# Patient Record
Sex: Female | Born: 1980 | Race: White | Hispanic: No | Marital: Single | Smoking: Former smoker
Health system: Western US, Community
[De-identification: ages and names within clinical notes are randomized; demographics above are authoritative.]

---

## 2019-10-11 NOTE — Telephone Encounter (Signed)
LVM to schedule w/ GO from referral

## 2019-11-15 NOTE — Telephone Encounter (Signed)
LVM to r/s 4/14 appointment to the [facility] clinic

## 2019-11-17 ENCOUNTER — Encounter: Attending: MD

## 2019-11-17 ENCOUNTER — Ambulatory Visit: Admit: 2019-11-17 | Discharge: 2019-11-30 | Payer: TRICARE (CHAMPUS) | Attending: MD

## 2019-11-17 DIAGNOSIS — M5126 Other intervertebral disc displacement, lumbar region: Secondary | ICD-10-CM

## 2019-11-17 NOTE — Progress Notes (Signed)
Melanie Miller is a 39 y.o. female.   Chief Complaint   Patient presents with   . Back Pain     L-spine         HISTORY OF PRESENT ILLNESS     Back Pain  This is a chronic problem. The pain is present in the lumbar spine. The quality of the pain is described as aching, burning and shooting. Radiates to: bilateral legs. The symptoms are aggravated by bending and standing (kneeling, lifting, sitting on the toilet too long). Associated symptoms include bladder incontinence, headaches, leg pain, numbness and tingling. Pertinent negatives include no bowel incontinence or weakness. Risk factors include obesity and menopause. She has tried NSAIDs, analgesics, chiropractic manipulation, heat and bed rest (Icy Hot patches, Bio Freeze Patches, massage) for the symptoms.   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 presents with low back pain for years but worse with acute flareup of the last 2 years low back worse and radicular leg pain with numbness into the legs worse in the feet with weakness and pain  Evaluated at [facility] with injections with some success  Diagnosed with idiopathic intracranial hypertension with spinal taps complicated by spinal headaches and pain  Pain with sitting better with squatting laying down okay walking  Chronic neck and shoulder pain  Pain varies from 3-8 out of 10 worse with bending lifting standing better laying down giving way and numbness in both legs and knees  X-rays lumbar spine 09/13/2019 AP lateral view confirms degenerative changes facet joints particularly L4-5 alignment otherwise acceptable      No past medical history on file.  No past surgical history on file.  Allergies   Allergen Reactions   . Amoxicillin Nausea And Vomiting   . Doxycycline Other (See Comments)     Idiopathic intercranial hypetertension   . Oxycodone Hives     No family history on file.   Social History     Tobacco Use    Smoking Status Never Smoker   Smokeless Tobacco Never Used   Tobacco Comment    medical cannabus     Body mass index is 40.77 kg/m?Marland Kitchen       Last A1C: No results found for: HGBA1C, HGBA1CPOCT, GLU    Outpatient Medications Marked as Taking for the 11/17/19 encounter (Office Visit) with Lonia Mad, MD   Medication Sig Dispense Refill   . cyclobenzaprine (FLEXERIL) 10 mg tablet Take 10 mg by mouth 3 times daily as needed for Muscle spasms.     Marland Kitchen estradioL (CLIMARA) 0.1 mg/24 hr Place 1 patch onto the skin once a week.     Marland Kitchen ibuprofen (ADVIL) 800 MG tablet Take 800 mg by mouth every 6 hours as needed for Pain.          REVIEW OF SYSTEMS   Review of Systems   Gastrointestinal: Negative for bowel incontinence.   Genitourinary: Positive for bladder incontinence.   Musculoskeletal: Positive for back pain.   Neurological: Positive for tingling, numbness and headaches. Negative for weakness.       Complete review of systems performed in office via patient questionnaire.     PHYSICAL EXAM   There were no vitals filed for this visit.  Height: 5' 5 (165.1 cm)  Wt Readings from Last 3 Encounters:   11/17/19 245 lb (111.1 kg)       Ortho Exam  Physical Exam  39 year old white female antalgic gait slow  to walk on toes and heels with back and radicular pain  Lumbar spine loss with loss of lordosis muscle spasm tenderness crepitus swelling no instability  Cervical spine pain on end range motion crepitus swelling no instability  Upper extremities restricted motion both shoulders positive impingement sign mild sensory changes minimal weakness upper extremities reflexes diminished  Lower extremities restricted motion hips knees ankles with patchy sensory changes diminished reflexes  Patient is awake alert and fully oriented, no acute distress well-nourished,   Head and neck within normal limits  Neck examination,negative Spurling sign, negative Lhermittes sign  Thoracic examination, normal alignment full range of motion no local  tenderness  Upper extremities, full range of motion shoulder elbow and wrists without pain tenderness or limitation no instability no swelling. Normal motor function without atrophy  vascular examination normal, no evidence of lymphadenopathy  Lower extremities, full range of motion hips knees and ankles without pain tenderness or limitation no local swelling or instability. Motor examination within normal limits without atrophy. . Vascular examination normal        *All or part of this note was transcribed using speech recognition software and may have been transferred into this note. Please contact us for clarification if any questions arise relating to the wording of this document.      IMAGING STUDY     No results found.       Additional Imaging  X-rays lumbar spine 09/13/2019 AP lateral view confirms degenerative changes facet joints particularly L4-5 alignment otherwise acceptable    ASSESSMENT        SNOMED CT(R)    1. Lumbar herniated disc  PROLAPSED LUMBAR INTERVERTEBRAL DISC    2. Lumbar radiculopathy  LUMBAR RADICULOPATHY    3. Herniated cervical intervertebral disc  PROLAPSED CERVICAL INTERVERTEBRAL DISC        #1 chronic mechanical back radicular leg pain numbness and weakness worse in the last 2 years prior work-up at [facility] with injections degenerative changes with facet arthropathy L4-5 potential stenosis lumbar spine versus herniated disc  #2 chronic neck and radicular shoulder pain  #3 shoulder impingement  #4 idiopathic intracranial hypertension serial spinal taps  #5 [employer] service-connected posttraumatic stress disorder  #6 allergic to amoxicillin oxycodone doxycycline       PLAN        Today we discussed proceeding with MRI imaging lumbar spine to assess the canal and discs in more detail  Further work-up could include conservative measures therapy and spinal injections which are diagnostic and therapeutic including facet blocks versus selective nerve root blocks  Potential work-up and treatment  with management with pain management           No follow-ups on file.        *This note was dictated using voice recognition software. If you have any questions concerning the content, please call our office at [phone]  *All or part of this note was transcribed using speech recognition software and may have been transferred into this note. Please contact us for clarification if any questions arise relating to the wording of this document.

## 2019-11-26 ENCOUNTER — Ambulatory Visit: Admit: 2019-11-26 | Discharge: 2019-11-29 | Attending: Hematology & Oncology

## 2019-11-26 DIAGNOSIS — D72829 Elevated white blood cell count, unspecified: Secondary | ICD-10-CM

## 2019-11-26 NOTE — Consults (Signed)
Pt has no symptoms to report.

## 2019-11-26 NOTE — Consults (Signed)
[facility]  [facility], [place]  Medical Oncology Consultation    PATIENT: Melanie Miller, Melanie Miller  CHART#: [id number]  ACCT#: [id number]  PATIENT DOB: 1981/02/01  DATE OF SERVICE:  11/26/2019.    REFERRING PHYSICIAN:  Charisse N. Siapno, MD.    CHIEF COMPLAINT:  Leukocytosis.    HISTORY OF PRESENT ILLNESS:   Recently, on 09/16/2019, Melanie Miller had a  repeat CBC and that showed that she had a persistent elevated white count   at 13,600.  Her hemoglobin was normal at 14.4, her platelet count was   normal at 253.  The patient had 63% granulocytes, 26 lymphs and 8   monocytes.  Patient is followed by [name], MD.  Melanie Miller  other CBC, done several months before, likewise showed an elevated white   count at 12,000 (above the normal range of 6000 to 10,000) and because of   this a consultation was obtained with hematology.    PAST MEDICAL HISTORY:  The patient has had a longstanding problem with low  back pain.  Most recently, she was seen by  [name]. [name], MD, and orthopedist and he has ordered an MRI of the   lumbar spine.  Melanie Miller has had pain in the back for years.  When I   say for years, at least 6 or 7 years, and she was treated at the [facility]   several years ago with steroid injections that seemed to improve her back   discomfort.  Over the last 6 months, the pain has gotten more severe and   because of this, she got an appointment to see [name]. [name], MD.    The patient has pain over her L-spine and it seems to be aggravated by   periods of standing, bending, lifting, or kneeling.  It is an aching   discomfort and it radiates down into the legs.  The patient, at times, has  some bladder incontinence and she feels some numbness and tingling down   both legs.  The patient also has problems with pain in her neck with   radiation into her right shoulder.  This has been stable for several   years.  It is the pain in the back that is increased and because of this   patient is  going to get an MRI when the [place] approves of the procedure.  The  patient has idiopathic intracranial hypertension and this dates back 5 or   6 years.  It is felt, or was felt, that perhaps this was brought about by   doxycycline.  The patient is followed by a neuroophthalmologist who looks   for papilledema and when this occurred they would do lumbar punctures to   tap off the fluid.  The etiology of idiopathic intracranial hypertension   is not known.  It usually occurs in females and they are usually fairly   overweight.    Patient is a never smoker.  She does smoke marijuana cigarettes and this   is intermittently.  The patient stopped smoking 13 years ago, but she   takes cannabis medically.  She has aching in her shoulder and neck with   some neuropathy in her hands.  The patient had a hysterectomy and   oophorectomy many years ago, in 2006, because of endometriosis.  The   patient did not get on hormonal replacement therapy until about 3 or 4   years ago.  The patient was in the [place] and she  has 100% disability from   the [facility] for posttraumatic stress disorder and that was not inquired into.    FAMILY HISTORY:  The patient is adopted and knows of no diseases that run   in her family.  She has traced down her biological father and he lives in   [place] and is married and has several children, but he does not   wish to see her, nor has he ever contacted her.  Her adoptive mother died   of multiple myeloma when she was 39 years of age and she went off from   [place] to live with her father who lived in [place], [place].  Her   adoptive father subsequently developed a high-grade large B cell lymphoma   and was cured with chemotherapy.  The patient lived in [place] from 39 years  of age until she went into the [place] at 45.    SOCIAL HISTORY:  Again, the patient was adopted in [place].  Her parents   broke up I guess when she was 49 years of age and she went to live with her  mother in [place] and lived with her  mother for 4 years and then her   mother passed away of multiple myeloma.  Then she moved back to [place],   [place] where her father lived and she was raised by her father and his   family until she was 34 (for 10 years).  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  The patient went into the [employer] and was in the [employer] for 4 or   5 years.  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  The patient has been   divorced now for 18 years.  She raised her children in [place], [place].    She moved up here to [place] [facility] [facility] 2 or 3 years ago to get out of the [place]  and [place].  She lives in [place] by herself.  She has dogs.  She   has no unusual hobbies.  She does not work with sprays, acrylics, paints,   pesticides, etc.  She works at [employer].  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    REVIEW OF SYSTEMS:  The patient does not have any headaches.  She has not   had a spinal tap in more than 2-1/2 years to offload the pressure in this   normal pressure hydrocephalus.  Her appetite is the same.  She relates   that all the doctors tell her that her problems are because she is   overweight.  The patient has no problems swallowing, no cough, no chest   pain, nothing that sounds like angina.  No real arthritis, except for in   the low back and in the right shoulder.  She does have numbness and   tingling in her hands and feet and the feet is due to radiation of pain   from her low back.  The patient has not had a change in bowel habits.  She  states when she lies down horizontally she will fall asleep.  In general,   she has been doing well and feeling well.  She has had problems with   depression on  and off all of her life and she is on no medications for   this right now.  She takes Flexeril t.i.d. as needed for back pain, but   there is really nothing for her depression.  She is on estradiol 0.1 mg 2   patches once a week.  She occasionally takes ibuprofen for her headaches.   Again, the patient has no new joint or skin problems.    PHYSICAL EXAMINATION:  VITAL SIGNS:  Her blood pressure is 125/60.  Her temperature is 98.  Her   pulse is 80.  Her O2 sats are 95%.  The patient is 5 feet 5 inches.  She   is 242 pounds.  GENERAL:  Reveals a 39 year old lady who appears her stated age.  She has   brown hair, brown eyes. She is a big lady.  She is somewhat overweight,   but she has fairly large hands.  She has broad shoulders, she has a square  jaw and a large head.  She has very hypertrophied muscles in her calves   and thighs.  She is overweight, but she is also quite muscular.  She is   alert and conversive.  HEENT:  Mouth is without lesions.  NECK:  Without adenopathy.  CHEST:  Clear.  HEART:  She has a regular rate and rhythm without murmurs.  UPPER EXTREMITIES:  Fairly large and muscular.  There is excess adiposity   in the upper arms bilaterally.  She has large tattoos on both of her   forearms and her upper extremities as well.  AXILLAE:  Without adenopathy.  BREASTS:  Symmetrical, without masses.  ABDOMEN:  Soft, obese, without organomegaly.  EXTREMITIES:  Quite well developed - her forearms, her hands, her calf   muscles and her thighs.    LABORATORIES, IMAGING AND ELECTRONIC RECORD:  White count done back on   10/27/2019 (a month ago) shows an elevated white count is 11.8, red blood   cells are 5.26 - elevated.  Hemoglobin is 15.3, hematocrit is 45.7, MCV is  86.9, MCH is 29.1, MCHC is 33.5, all within normal limits.  The patient's   differential shows 58% neutrophils, 32% lymphs, 7 monocytes, 1 eosinophil   and 0.6% basophils.  Differential on 09/16/2019 was normal.  Lipid panel   shows a cholesterol  of 216, HDL is depressed at 34.8.  Triglycerides are   elevated at 229 and LDL cholesterol of 136.  CBC 6 months ago (in 04/2019)  shows that her white count at that point in time was likewise elevated at   11.8, hemoglobin was 14.6, hematocrit was 43.7.  Metabolic profile done 10  months ago revealed no abnormalities.  Hepatitis C antibody screen is   negative.    IMPRESSION:  1.  Leukocytosis, mild, with normal hemoglobin and platelet count as well   as a normal differential.  So just pure leukocytosis with no abnormal   cells.  2.  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  3.  Endometriosis post total abdominal hysterectomy done back in 2006   without any hormonal replacement therapy until 2 or 3 years ago.  4.  Posttraumatic stress disorder by history -- on disability for same.    PLAN:  On physical examination  and by review of systems, except for the   chronic back pain, the patient is stable and doing quite well.  The   patient is adopted and we do not know her family history, but both her   adoptive parents have suffered problems due to hematological problems -   plasma cell myeloma and large B cell lymphoma.  The patient has grown   children, but they live far away out of state.  Patient is divorced and   has been divorced now for probably 20 years.  The patient has no unusual   hobbies, does not work with any sprays or acrylics.  I do not think this   is related to any type of exposure.  At the present time, would just   recommend following the patient with CBCs every 6 months.  Dr. Ivan Anchors, who is a famous hematologist at the [facility] followed   more than 100 patients with leukocytosis with a normal differential for   20-25 years.  None of these patients ever evolved to have any significant   problems.  There was an increased incidence of significant COPD in the   smokers and his postulant was that this may be due to mild  subclinical   chronic bronchitis or chronic inflammation of the airways due to inhaled   particulate matter.  The patient smoked regularly until 2008 and now she   is using marijuana on a regular basis, which is probably equivalently   toxic to tobacco.  I would just recommend at this point in time for the   patient to have Charisse N. Siapno, MD, check her CBC every 6 months.  I   do not think anything bad is going on with the patient as far as a   hematological problem.    Rhona Raider Tyra Michelle, MD  D: 11/26/2019 17:41  T: 11/27/2019 10:18  J/R: [id number]  SWEMA / LESLD  KG:[id number] N. Megan Salon, MD

## 2019-12-06 ENCOUNTER — Ambulatory Visit: Admit: 2019-12-06 | Payer: TRICARE (CHAMPUS) | Attending: DO

## 2019-12-06 DIAGNOSIS — R198 Other specified symptoms and signs involving the digestive system and abdomen: Secondary | ICD-10-CM

## 2019-12-06 NOTE — H&P (Signed)
CC: Have leakage from the anus    HPI:    This is a pleasant 38 y.o. female who was referred by Davis, Denise A, FNP-C for Anal discharge.  She has continuous clear mucus/fluid discharges from the anal opening.  It occurs on a daily basis.  She has to placing gauze up in the area and replace it multiple times during the day.  She denies any blood or mucus in her stools.  There is been no changes in her bowel habits.  She typically has 3-4 bowel movements on a daily basis.  There is no family history of colon or rectal cancer.    PMH:    Past Medical History:   Diagnosis Date   . Anemia    . Anxiety    . Asthma      Family History   Adopted: Yes     Past Surgical History:   Procedure Laterality Date   . HYSTERECTOMY     . OOPHORECTOMY       Social History     Socioeconomic History   . Marital status: Single     Spouse name: Not on file   . Number of children: Not on file   . Years of education: Not on file   . Highest education level: Not on file   Occupational History   . Not on file   Social Needs   . Financial resource strain: Not on file   . Food insecurity     Worry: Not on file     Inability: Not on file   . Transportation needs     Medical: Not on file     Non-medical: Not on file   Tobacco Use   . Smoking status: Former Smoker     Packs/day: 1.00     Types: Cigarettes     Quit date: 2008     Years since quitting: 13.3   . Smokeless tobacco: Never Used   . Tobacco comment: medical cannabus   Substance and Sexual Activity   . Alcohol use: Not Currently   . Drug use: Not Currently   . Sexual activity: Not Currently   Lifestyle   . Physical activity     Days per week: Not on file     Minutes per session: Not on file   . Stress: Not on file   Relationships   . Social connections     Talks on phone: Not on file     Gets together: Not on file     Attends religious service: Not on file     Active member of club or organization: Not on file     Attends meetings of clubs or organizations: Not on file     Relationship  status: Not on file   . Intimate partner violence     Fear of current or ex partner: Not on file     Emotionally abused: Not on file     Physically abused: Not on file     Forced sexual activity: Not on file   Other Topics Concern   . Caffeine Concern Not Asked   . Exercise Not Asked   Social History Narrative   . Not on file         Outpatient Encounter Medications as of 12/06/2019   Medication Sig Dispense Refill   . estradioL (CLIMARA) 0.1 mg/24 hr Place 2 patches onto the skin once a week.      . ibuprofen (ADVIL) 800   MG tablet Take 800 mg by mouth every 6 hours as needed for Pain.       No facility-administered encounter medications on file as of 12/06/2019.      Allergies   Allergen Reactions   . Amoxicillin Nausea And Vomiting   . Doxycycline Other (See Comments)     Idiopathic intercranial hypetertension   . Oxycodone Hives         Review of Systems   Constitutional: Positive for fatigue.   Eyes: Positive for photophobia.   Musculoskeletal: Positive for back pain and neck pain.   Neurological: Positive for headaches.   All other systems reviewed and are negative.        Physical Exam  Constitutional:       Appearance: She is well-developed.   HENT:      Head: Normocephalic and atraumatic.   Eyes:      Pupils: Pupils are equal, round, and reactive to light.   Neck:      Musculoskeletal: Normal range of motion and neck supple.   Cardiovascular:      Rate and Rhythm: Normal rate and regular rhythm.   Pulmonary:      Effort: Pulmonary effort is normal.      Breath sounds: Normal breath sounds.   Abdominal:      General: Bowel sounds are normal.      Palpations: Abdomen is soft.   Genitourinary:     Comments: There are some anal tags around the anal opening.  No fistula fissure or thrombosis is noted.  Digital rectal exam is normal.  Anoscopy reveals grade 2 internal hemorrhoids in all 3 quadrants without inflammation or irritation.  There is normal-appearing rectal mucosa above the dentate line.  Musculoskeletal:  Normal range of motion.   Skin:     General: Skin is warm and dry.   Neurological:      Mental Status: She is alert and oriented to person, place, and time.         Vitals:    12/06/19 1410   Pulse: 93   SpO2: 97%       Assessment:  Problem List Items Addressed This Visit     Anal discharge - Primary          No follow-ups on file.    Plan:  ---The etiology of the problem has been discussed and explained.  I have described the various treatment options with my recommendations and a handout has been provided.  All of their questions have been answered and discussed.  ---I have recommended that the patient undergo the discussed surgical procedure.  ---A PARQ discussion detailing the risks, benefits, and alternative options for the procedure have been discussed in detail.  We discussed the risks of bleeding, infection, perforation, missed lesions, and failure to resolve the issue.  All of the questions have been answered and discussed.  ASA Class I  ---This note was, in part, created with the aid of voice recognition software.  ---On examination today I do not see any of the usual problems that would precipitate anal discharge.  She has minimal internal hemorrhoids.  There is no inflammation of the lower rectum.  There is no anal fistula and I see no evidence of ulcerative colitis or Crohn's disease around the lower rectum.  I have recommended a colonoscopy to evaluate for possible solitary rectal ulcer or collagenous colitis.  I have reviewed with her the risk, benefits and possible complications of the procedure and all of her   questions have been answered today.    ---She will be set up for a procedure:  ---Colonoscopy--[id number] with moderate sedation under propofol

## 2019-12-06 NOTE — H&P (View-Only) (Signed)
CC: Have leakage from the anus    HPI:    This is a pleasant 39 y.o. female who was referred by Willy Eddy, FNP-C for Anal discharge.  She has continuous clear mucus/fluid discharges from the anal opening.  It occurs on a daily basis.  She has to placing gauze up in the area and replace it multiple times during the day.  She denies any blood or mucus in her stools.  There is been no changes in her bowel habits.  She typically has 3-4 bowel movements on a daily basis.  There is no family history of colon or rectal cancer.    PMH:    Past Medical History:   Diagnosis Date   . Anemia    . Anxiety    . Asthma      Family History   Adopted: Yes     Past Surgical History:   Procedure Laterality Date   . HYSTERECTOMY     . OOPHORECTOMY       Social History     Socioeconomic History   . Marital status: Single     Spouse name: Not on file   . Number of children: Not on file   . Years of education: Not on file   . Highest education level: Not on file   Occupational History   . Not on file   Social Needs   . Financial resource strain: Not on file   . Food insecurity     Worry: Not on file     Inability: Not on file   . Transportation needs     Medical: Not on file     Non-medical: Not on file   Tobacco Use   . Smoking status: Former Smoker     Packs/day: 1.00     Types: Cigarettes     Quit date: 2008     Years since quitting: 13.3   . Smokeless tobacco: Never Used   . Tobacco comment: medical cannabus   Substance and Sexual Activity   . Alcohol use: Not Currently   . Drug use: Not Currently   . Sexual activity: Not Currently   Lifestyle   . Physical activity     Days per week: Not on file     Minutes per session: Not on file   . Stress: Not on file   Relationships   . Social Wellsite geologist on phone: Not on file     Gets together: Not on file     Attends religious service: Not on file     Active member of club or organization: Not on file     Attends meetings of clubs or organizations: Not on file     Relationship  status: Not on file   . Intimate partner violence     Fear of current or ex partner: Not on file     Emotionally abused: Not on file     Physically abused: Not on file     Forced sexual activity: Not on file   Other Topics Concern   . Caffeine Concern Not Asked   . Exercise Not Asked   Social History Narrative   . Not on file         Outpatient Encounter Medications as of 12/06/2019   Medication Sig Dispense Refill   . estradioL (CLIMARA) 0.1 mg/24 hr Place 2 patches onto the skin once a week.      Marland Kitchen ibuprofen (ADVIL) 800  MG tablet Take 800 mg by mouth every 6 hours as needed for Pain.       No facility-administered encounter medications on file as of 12/06/2019.      Allergies   Allergen Reactions   . Amoxicillin Nausea And Vomiting   . Doxycycline Other (See Comments)     Idiopathic intercranial hypetertension   . Oxycodone Hives         Review of Systems   Constitutional: Positive for fatigue.   Eyes: Positive for photophobia.   Musculoskeletal: Positive for back pain and neck pain.   Neurological: Positive for headaches.   All other systems reviewed and are negative.        Physical Exam  Constitutional:       Appearance: She is well-developed.   HENT:      Head: Normocephalic and atraumatic.   Eyes:      Pupils: Pupils are equal, round, and reactive to light.   Neck:      Musculoskeletal: Normal range of motion and neck supple.   Cardiovascular:      Rate and Rhythm: Normal rate and regular rhythm.   Pulmonary:      Effort: Pulmonary effort is normal.      Breath sounds: Normal breath sounds.   Abdominal:      General: Bowel sounds are normal.      Palpations: Abdomen is soft.   Genitourinary:     Comments: There are some anal tags around the anal opening.  No fistula fissure or thrombosis is noted.  Digital rectal exam is normal.  Anoscopy reveals grade 2 internal hemorrhoids in all 3 quadrants without inflammation or irritation.  There is normal-appearing rectal mucosa above the dentate line.  Musculoskeletal:  Normal range of motion.   Skin:     General: Skin is warm and dry.   Neurological:      Mental Status: She is alert and oriented to person, place, and time.         Vitals:    12/06/19 1410   Pulse: 93   SpO2: 97%       Assessment:  Problem List Items Addressed This Visit     Anal discharge - Primary          No follow-ups on file.    Plan:  ---The etiology of the problem has been discussed and explained.  I have described the various treatment options with my recommendations and a handout has been provided.  All of their questions have been answered and discussed.  ---I have recommended that the patient undergo the discussed surgical procedure.  ---A PARQ discussion detailing the risks, benefits, and alternative options for the procedure have been discussed in detail.  We discussed the risks of bleeding, infection, perforation, missed lesions, and failure to resolve the issue.  All of the questions have been answered and discussed.  ASA Class I  ---This note was, in part, created with the aid of voice recognition software.  ---On examination today I do not see any of the usual problems that would precipitate anal discharge.  She has minimal internal hemorrhoids.  There is no inflammation of the lower rectum.  There is no anal fistula and I see no evidence of ulcerative colitis or Crohn's disease around the lower rectum.  I have recommended a colonoscopy to evaluate for possible solitary rectal ulcer or collagenous colitis.  I have reviewed with her the risk, benefits and possible complications of the procedure and all of her  questions have been answered today.    ---She will be set up for a procedure:  ---Colonoscopy--[id number] with moderate sedation under propofol

## 2019-12-07 NOTE — Addendum Note (Signed)
Addended by: Eitan Doubleday. SCOTT on: 12/08/2019 07:36 AM     Modules accepted: Orders

## 2019-12-28 LAB — TISSUE EXAM

## 2019-12-28 MED ORDER — sterile water (bottle) irrigation
Status: DC | PRN
Start: 2019-12-28 — End: 2019-12-28
  Administered 2019-12-28: 18:00:00

## 2019-12-28 MED ORDER — Lactated Ringers infusion
INTRAVENOUS | Status: DC
Start: 2019-12-28 — End: 2019-12-28
  Administered 2019-12-28: 17:00:00 via INTRAVENOUS

## 2019-12-28 MED ORDER — propofoL (DIPRIVAN) injection
10 | INTRAVENOUS | Status: DC | PRN
Start: 2019-12-28 — End: 2019-12-28
  Administered 2019-12-28 (×12): 10 mg/mL via INTRAVENOUS

## 2019-12-28 NOTE — Procedures (Signed)
[facility]  Patient Name: Melanie Miller  MRN: [id number]  Date of Birth: 1981-01-28  Attending MD: Ander Purpura , DO  Procedure Date No Time: 12/28/2019  Age: 39  Procedure:         Colonoscopy  Indications:       Persistent anal discharge  Referring MD:        Medicines:         See the Anesthesia note for documentation of the                      administered medications, Please refer to the [facility] in the                      patient Electronic Medical Record.  Complications:     No immediate complications.  Procedure:         After I obtained informed consent, the scope was passed                      under direct vision. Throughout the procedure, the                      patient's blood pressure, pulse, and oxygen saturations                      were monitored continuously. The Colonoscope: PCF-H190L -                      [id number] was introduced through the anus and advanced to                      the terminal ileum. The colonoscopy was performed without                      difficulty. The patient tolerated the procedure well. The                      quality of the bowel preparation was good.  Findings:       The perianal and digital rectal examinations were normal.       A 4 mm polyp was found in the rectum (benign-appearing lesion). The        polyp was sessile. The polyp was removed with a hot snare. Resection and        retrieval were complete.       An anal papillae were seen in the anal canal. The polyp was removed with        a hot snare. Polyp resection was incomplete. The resected tissue was        retrieved. Fulguration to ablate the lesion remnants by snare was        successful.       A few small and large-mouthed diverticula were found in the sigmoid        colon.  Impression:        - One benign appearing 4 mm polyp in the rectum, removed                      with a hot snare. Resected and retrieved.  Recommendation:       - Discharge patient to home.       - Repeat  colonoscopy in 5-10 years for surveillance based on pathology  results.  Moderate Sedation:       Moderate (conscious) sedation was administered by the endoscopy nurse        and supervised by the endoscopist. The patient's oxygen saturation,        heart rate, blood pressure and response to care were monitored.  Tawny Hopping, DO  Ander Purpura, DO  12/28/2019 11:22:05 AM  This report has been signed electronically.  Number of Addenda: 0       [facility]

## 2019-12-28 NOTE — Discharge Instructions (Signed)
Patient Education     Colonoscopy, Adult, Care After  This sheet gives you information about how to care for yourself after your procedure. Your doctor may also give you more specific instructions. If you have problems or questions, call your doctor.  What can I expect after the procedure?  After the procedure, it is common to have:  ? A small amount of blood in your poop for 24 hours.  ? Some gas.  ? Mild cramping or bloating in your belly.  Follow these instructions at home:  General instructions  ? For the first 24 hours after the procedure:  ? Do not drive or use machinery.  ? Do not sign important documents.  ? Do not drink alcohol.  ? Do your daily activities more slowly than normal.  ? Eat foods that are soft and easy to digest.  ? Take over-the-counter or prescription medicines only as told by your doctor.  To help cramping and bloating:    ? Try walking around.  ? Put heat on your belly (abdomen) as told by your doctor. Use a heat source that your doctor recommends, such as a moist heat pack or a heating pad.  ? Put a towel between your skin and the heat source.  ? Leave the heat on for 20-30 minutes.  ? Remove the heat if your skin turns bright red. This is especially important if you cannot feel pain, heat, or cold. You can get burned.  Eating and drinking    ? Drink enough fluid to keep your pee (urine) clear or pale yellow.  ? Return to your normal diet as told by your doctor. Avoid heavy or fried foods that are hard to digest.  ? Avoid drinking alcohol for as long as told by your doctor.  Contact a doctor if:  ? You have blood in your poop (stool) 2-3 days after the procedure.  Get help right away if:  ? You have more than a small amount of blood in your poop.  ? You see large clumps of tissue (blood clots) in your poop.  ? Your belly is swollen.  ? You feel sick to your stomach (nauseous).  ? You throw up (vomit).  ? You have a fever.  ? You have belly pain that gets worse, and medicine does not help  your pain.  Summary  ? After the procedure, it is common to have a small amount of blood in your poop. You may also have mild cramping and bloating in your belly.  ? For the first 24 hours after the procedure, do not drive or use machinery, do not sign important documents, and do not drink alcohol.  ? Get help right away if you have a lot of blood in your poop, feel sick to your stomach, have a fever, or have more belly pain.  This information is not intended to replace advice given to you by your health care provider. Make sure you discuss any questions you have with your health care provider.  Document Released: 08/24/2010 Document Revised: 05/22/2017 Document Reviewed: 04/15/2016  Elsevier Interactive Patient Education ? 2019 Elsevier Inc.     Patient Education     Colon Polyps  Polyps are tissue growths inside the body. Polyps can grow in many places, including the large intestine (colon). A polyp may be a round bump or a mushroom-shaped growth. You could have one polyp or several.  Most colon polyps are noncancerous (benign). However, some colon polyps can become   cancerous over time.  What are the causes?  The exact cause of colon polyps is not known.  What increases the risk?  This condition is more likely to develop in people who:  ? Have a family history of colon cancer or colon polyps.  ? Are older than 50 or older than 45 if they are African American.  ? Have inflammatory bowel disease, such as ulcerative colitis or Crohn disease.  ? Are overweight.  ? Smoke cigarettes.  ? Do not get enough exercise.  ? Drink too much alcohol.  ? Eat a diet that is:  ? High in fat and red meat.  ? Low in fiber.  ? Had childhood cancer that was treated with abdominal radiation.    What are the signs or symptoms?  Most polyps do not cause symptoms. If you have symptoms, they may include:  ? Blood coming from your rectum when having a bowel movement.  ? Blood in your stool.?The stool may look dark red or black.  ? A change in  bowel habits, such as constipation or diarrhea.    How is this diagnosed?  This condition is diagnosed with a colonoscopy. This is a procedure that uses a lighted, flexible scope to look at the inside of your colon.  How is this treated?  Treatment for this condition involves removing any polyps that are found. Those polyps will then be tested for cancer. If cancer is found, your health care provider will talk to you about options for colon cancer treatment.  Follow these instructions at home:  Diet  ? Eat plenty of fiber, such as fruits, vegetables, and whole grains.  ? Eat foods that are high in calcium and vitamin D, such as milk, cheese, yogurt, eggs, liver, fish, and broccoli.  ? Limit foods high in fat, red meats, and processed meats, such as hot dogs, sausage, bacon, and lunch meats.  ? Maintain a healthy weight, or lose weight if recommended by your health care provider.  General instructions  ? Do not smoke cigarettes.  ? Do not drink alcohol excessively.  ? Keep all follow-up visits as told by your health care provider. This is important. This includes keeping regularly scheduled colonoscopies. Talk to your health care provider about when you need a colonoscopy.  ? Exercise every day or as told by your health care provider.  Contact a health care provider if:  ? You have new or worsening bleeding during a bowel movement.  ? You have new or increased blood in your stool.  ? You have a change in bowel habits.  ? You unexpectedly lose weight.  This information is not intended to replace advice given to you by your health care provider. Make sure you discuss any questions you have with your health care provider.  Document Released: 04/17/2004 Document Revised: 12/28/2015 Document Reviewed: 06/12/2015  Elsevier Interactive Patient Education ? 2019 Elsevier Inc.     Patient Education     Moderate Conscious Sedation, Adult, Care After  These instructions provide you with information about caring for yourself  after your procedure. Your health care provider may also give you more specific instructions. Your treatment has been planned according to current medical practices, but problems sometimes occur. Call your health care provider if you have any problems or questions after your procedure.  What can I expect after the procedure?  After your procedure, it is common:  ? To feel sleepy for several hours.  ? To feel clumsy and   have poor balance for several hours.  ? To have poor judgment for several hours.  ? To vomit if you eat too soon.  Follow these instructions at home:  For at least 24 hours after the procedure:    ? Do not:  ? Participate in activities where you could fall or become injured.  ? Drive.  ? Use heavy machinery.  ? Drink alcohol.  ? Take sleeping pills or medicines that cause drowsiness.  ? Make important decisions or sign legal documents.  ? Take care of children on your own.  ? Rest.  Eating and drinking  ? Follow the diet recommended by your health care provider.  ? If you vomit:  ? Drink water, juice, or soup when you can drink without vomiting.  ? Make sure you have little or no nausea before eating solid foods.  General instructions  ? Have a responsible adult stay with you until you are awake and alert.  ? Take over-the-counter and prescription medicines only as told by your health care provider.  ? If you smoke, do not smoke without supervision.  ? Keep all follow-up visits as told by your health care provider. This is important.  Contact a health care provider if:  ? You keep feeling nauseous or you keep vomiting.  ? You feel light-headed.  ? You develop a rash.  ? You have a fever.  Get help right away if:  ? You have trouble breathing.  This information is not intended to replace advice given to you by your health care provider. Make sure you discuss any questions you have with your health care provider.  Document Released: 05/12/2013 Document Revised: 12/25/2015 Document Reviewed:  11/11/2015  Elsevier Interactive Patient Education ? 2019 Elsevier Inc.

## 2019-12-28 NOTE — Interval H&P Note (Signed)
I have assessed the patient.    No interval change in H&P.    [facility] Patient Class Confirmed.

## 2020-01-04 NOTE — Telephone Encounter (Addendum)
Called pt, recall entered. Results relayed.         ----- Message from Meliton Rattan, DO sent at 12/30/2019  7:10 AM PDT -----  Pathology results:    This patient had polyps.    Colonoscopy:  hyperplastic  Follow up colonoscopy in: 10 years  Follow up in the office if her mucus discharge continues.

## 2020-01-05 NOTE — Telephone Encounter (Signed)
Patient is calling in as she has some concerns that she would like to discuss.     Please call [phone]  Rip Harbour to leave a detailed message   Reached out to clinic and they were unavailable

## 2020-01-06 NOTE — Telephone Encounter (Signed)
Call returned, left message. 

## 2020-01-14 NOTE — Telephone Encounter (Signed)
Spoke with pt, appt made 

## 2020-01-14 NOTE — Telephone Encounter (Signed)
Patient called stating she has had pain and discomfort after her procedure.    Transferred call to HD.

## 2020-02-01 ENCOUNTER — Ambulatory Visit: Admit: 2020-02-01 | Payer: TRICARE (CHAMPUS) | Attending: DO

## 2020-02-01 DIAGNOSIS — L299 Pruritus, unspecified: Secondary | ICD-10-CM

## 2020-02-01 NOTE — Progress Notes (Signed)
HPI:    This is a pleasant 39 y.o. female who returns today for a discussion regarding an issue with Anal rectal pain following an excision of a hypertrophic anal papilla.  She tells me she is doing better currently.  She does not have a lot of pain but she is having itching and irritation around the anus.      PMH:    Past Medical History:   Diagnosis Date   . Anemia    . Anxiety    . Asthma      Family History   Adopted: Yes     Past Surgical History:   Procedure Laterality Date   . HYSTERECTOMY     . OOPHORECTOMY     . PROCEDURE N/A 12/28/2019    Procedure: FLEXIBLE COLONOSCOPY; REMOVAL OF POLYP WITH SNARE;  Surgeon: Meliton Rattan, DO;  Location: [facility] ENDO;  Service: Endoscopy;  Laterality: N/A;     Social History     Social History Narrative   . Not on file       Outpatient Medications Prior to Visit   Medication Sig Dispense Refill   . estradioL (CLIMARA) 0.1 mg/24 hr Place 2 patches onto the skin once a week.      Marland Kitchen ibuprofen (ADVIL) 800 MG tablet Take 800 mg by mouth every 6 hours as needed for Pain.       No facility-administered medications prior to visit.     Allergies   Allergen Reactions   . Amoxicillin Nausea And Vomiting   . Doxycycline Other (See Comments)     Idiopathic intercranial hypetertension   . Oxycodone Hives         Review of Systems   Gastrointestinal: Positive for rectal pain.   All other systems reviewed and are negative.      Vitals:    02/01/20 0927   Temp: 37 ?C (98.6 ?F)       Physical Exam  Constitutional:       Appearance: She is well-developed.   HENT:      Head: Normocephalic and atraumatic.   Eyes:      Pupils: Pupils are equal, round, and reactive to light.   Cardiovascular:      Rate and Rhythm: Normal rate and regular rhythm.   Pulmonary:      Effort: Pulmonary effort is normal.      Breath sounds: Normal breath sounds.   Abdominal:      General: Bowel sounds are normal.      Palpations: Abdomen is soft.   Genitourinary:     Comments: She does have pruritic-like change around  the anus.  I can see the site where the papilla was excised.  It is healing well but does appear fragile.  Musculoskeletal:         General: Normal range of motion.      Cervical back: Normal range of motion and neck supple.   Skin:     General: Skin is warm and dry.   Neurological:      Mental Status: She is alert and oriented to person, place, and time.           Assessment:  Problem List Items Addressed This Visit     Pruritus - Primary          No follow-ups on file.    Plan:  ---I have recommended a high fiber diet, increased water intake, Calmoseptine and stool softeners.  ---This note was, in part, created with  the aid of voice recognition software.  ---The wounds are healing nicely.  I suspect she may have torn open the area during a bowel movement and that may be why she has been experiencing some delay in her wound healing.  I have recommended calmoseptine to the irritated skin and fiber and water to keep her stools soft.  I will see her back in the future if she has any further problems.

## 2020-02-24 ENCOUNTER — Inpatient Hospital Stay: Admit: 2020-02-24 | Discharge: 2020-02-24 | Attending: MD

## 2020-02-24 DIAGNOSIS — M5126 Other intervertebral disc displacement, lumbar region: Secondary | ICD-10-CM

## 2020-02-25 NOTE — Telephone Encounter (Signed)
Please call patient and schedule follow up for MRI Lumbar  results done @ [facility] 7/22

## 2020-02-29 NOTE — Telephone Encounter (Signed)
scheduled

## 2020-03-24 ENCOUNTER — Encounter: Admit: 2020-03-24 | Payer: TRICARE (CHAMPUS) | Attending: MD

## 2020-03-24 NOTE — Progress Notes (Deleted)
Telephone Consultation Note:    Phyliss Hulick is a 39 y.o. ,female, calling today with a chief complaint of Follow-up (L-Spine MRI results)  .    This visit was conducted via telephone due to COVID-19 restrictions and at the request of the patient.  Dyane consented to this visit being performed over telephone.      Those present on the telephone call include: ***    HPI  ***  ROS    Current Outpatient Medications on File Prior to Visit   Medication Sig Dispense Refill   . estradioL (CLIMARA) 0.1 mg/24 hr Place 2 patches onto the skin once a week.      Marland Kitchen ibuprofen (ADVIL) 800 MG tablet Take 800 mg by mouth every 6 hours as needed for Pain.       No current facility-administered medications on file prior to visit.      Allergies   Allergen Reactions   . Amoxicillin Nausea And Vomiting   . Doxycycline Other (See Comments)     Idiopathic intercranial hypetertension   . Oxycodone Hives     Patient Active Problem List   Diagnosis SNOMED CT(R)   . Anal discharge DISCHARGE FROM ANUS   . Pruritus ITCHING OF SKIN     Social History     Tobacco Use   . Smoking status: Former Smoker     Packs/day: 1.00     Types: Cigarettes     Quit date: 2008     Years since quitting: 13.6   . Smokeless tobacco: Never Used   . Tobacco comment: medical cannabus   Substance Use Topics   . Alcohol use: Not Currently   . Drug use: Yes     Types: Marijuana        Assessment/Plan: (Include Medical Decision Making)  ***  No diagnosis found.     Total time spent on medical discussion with patient: ***

## 2020-04-06 ENCOUNTER — Telehealth: Admit: 2020-04-06 | Discharge: 2020-07-18 | Payer: TRICARE (CHAMPUS) | Attending: MD

## 2020-04-06 DIAGNOSIS — M5126 Other intervertebral disc displacement, lumbar region: Secondary | ICD-10-CM

## 2020-04-06 NOTE — Progress Notes (Signed)
Telephone Consultation Note:    Melanie Miller is a 39 y.o. ,female, calling today with a chief complaint of Back Pain (L-Spine MRI results)  .    This visit was conducted via telephone due to COVID-19 restrictions and at the request of the patient.  Melanie Miller consented to this visit being performed over telephone.      Those present on the telephone call include: Patient    Back Pain  The pain is present in the lumbar spine.     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       Current Outpatient Medications on File Prior to Visit   Medication Sig Dispense Refill   . estradioL (CLIMARA) 0.1 mg/24 hr Place 2 patches onto the skin once a week.      Marland Kitchen ibuprofen (ADVIL) 800 MG tablet Take 800 mg by mouth every 6 hours as needed for Pain.       No current facility-administered medications on file prior to visit.      Allergies   Allergen Reactions   . Amoxicillin Nausea And Vomiting   . Doxycycline Other (See Comments)     Idiopathic intercranial hypetertension   . Oxycodone Hives     Patient Active Problem List   Diagnosis SNOMED CT(R)   . Anal discharge DISCHARGE FROM ANUS   . Pruritus ITCHING OF SKIN     Social History     Tobacco Use   . Smoking status: Former Smoker     Packs/day: 1.00     Types: Cigarettes     Quit date: 2008     Years since quitting: 13.6   . Smokeless tobacco: Never Used   . Tobacco comment: medical cannabus   Substance Use Topics   . Alcohol use: Not Currently   . 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 Lumbar herniated disc  PROLAPSED LUMBAR INTERVERTEBRAL DISC    2. Herniated cervical intervertebral disc  PROLAPSED CERVICAL INTERVERTEBRAL DISC    3. 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 Spinal stenosis of lumbar region with neurogenic claudication  SPINAL STENOSIS OF LUMBAR REGION         Total time spent on medical discussion with patient: 11 minutes

## 2020-06-12 ENCOUNTER — Ambulatory Visit: Admit: 2020-06-12 | Discharge: 2020-06-12 | Attending: PA

## 2020-06-12 ENCOUNTER — Other Ambulatory Visit: Admit: 2020-06-12 | Discharge: 2020-06-12

## 2020-06-12 ENCOUNTER — Other Ambulatory Visit: Admit: 2020-06-14

## 2020-06-12 DIAGNOSIS — L732 Hidradenitis suppurativa: Secondary | ICD-10-CM

## 2020-06-12 DIAGNOSIS — C44311 Basal cell carcinoma of skin of nose: Secondary | ICD-10-CM

## 2020-06-12 LAB — CBC WITH AUTO DIFFERENTIAL
Basophils %: 1 % (ref 0–2)
Basophils, Absolute: 0.1 10*3/??L (ref 0.0–0.1)
Eosinophils %: 1 % (ref 0–5)
Eosinophils, Absolute: 0.2 10*3/??L (ref 0.0–0.4)
HCT: 44.3 % (ref 35.0–48.0)
Hemoglobin: 14.9 g/dL (ref 11.7–16.5)
Lymphocytes %: 22 % (ref 20–40)
Lymphocytes, Absolute: 2.7 10*3/??L (ref 1.0–4.0)
MCH: 29.7 pg (ref 28.3–33.3)
MCHC: 33.7 g/dL (ref 32.5–36.0)
MCV: 88.2 fL (ref 81.0–100.0)
MPV: 9.3 fL (ref 6.9–10.0)
Monocytes %: 8 % (ref 2–12)
Monocytes, Absolute: 0.9 10*3/??L (ref 0.2–1.0)
Neutrophils %: 69 % (ref 50–74)
Neutrophils, Absolute: 8.3 10*3/??L — ABNORMAL HIGH (ref 2.0–7.4)
Platelet Count: 245 10*3/??L (ref 150–405)
RBC: 5.03 10*6/??L (ref 3.80–5.60)
RDW: 13.6 % (ref 11.7–16.1)
WBC: 12.1 10*3/??L — ABNORMAL HIGH (ref 4.5–11.0)

## 2020-06-12 LAB — COMPREHENSIVE METABOLIC PANEL
ALT - Alanine Aminotransferase: 13 IU/L (ref 5–33)
AST - Aspartate Aminotransferase: 15 IU/L (ref 5–32)
Albumin/Globulin Ratio: 1.4 (ref 1.2–2.2)
Albumin: 4.3 g/dL (ref 3.5–5.0)
Alkaline Phosphatase: 95 IU/L (ref 35–105)
Anion Gap: 13.2 mmol/L (ref 9.0–18.0)
BUN / Creatinine Ratio: 20 (ref 12.0–20.0)
BUN: 12 mg/dL (ref 8–20)
Bilirubin Total: <0.2 mg/dL (ref 0.10–1.70)
CO2 - Carbon Dioxide: 22 mmol/L (ref 17–27)
Calcium: 9.2 mg/dL (ref 8.6–10.6)
Chloride: 106.8 mmol/L (ref 101.0–111.0)
Creatinine: 0.6 mg/dL (ref 0.60–1.30)
Glomerular Filtration Rate Estimate (Female): >90 mL/min/{1.73_m2} (ref >=60)
Glucose: 115 mg/dL — ABNORMAL HIGH (ref 74–106)
Osmolality Calculation: 283.8 mOsm/kg (ref 275.0–300.0)
Potassium: 4.17 mmol/L (ref 3.50–5.10)
Protein Total: 7.3 g/dL (ref 6.4–8.2)
Sodium: 142 mmol/L (ref 135–145)

## 2020-06-12 LAB — QUANTIFERON - TB GOLD PLUS
Mitgoen minus Nil Result: 8.15 IU/mL
Nil Result: 0.01 IU/mL
QuantiFERON-Tb Gold Plus Result: NEGATIVE
TB1 Ag minus Nil Result: 0 IU/mL
TB2 Ag minus Nil Result: 0 IU/mL

## 2020-06-12 LAB — TISSUE EXAM

## 2020-06-12 LAB — CHRONIC HEPATITIS PANEL (SLM)
Hepatitis B Core Antibody Interpretation: NONREACTIVE
Hepatitis B Surface Antibody Interpretation: POSITIVE — AB
Hepatitis B Surface Antigen Interpretation: NONREACTIVE
Hepatitis C Virus Antibody Interpretation: NONREACTIVE

## 2020-06-12 MED ORDER — adalimumab (HUMIRA,CF, PEN) 80 mg/0.8 mL PnKt
80 | SUBCUTANEOUS | 22 refills | 28.00000 days | Status: AC
Start: 2020-06-12 — End: 2021-06-12

## 2020-06-12 MED ORDER — adalimumab 80 mg/0.8 mL-40 mg/0.4 mL PnKt
80 | Freq: Once | SUBCUTANEOUS | 0 refills | 28.00000 days | Status: DC
Start: 2020-06-12 — End: 2020-06-15

## 2020-06-12 MED ORDER — clindamycin (CLEOCIN T) 1 % lotion
1 | TOPICAL | 2 refills | 5.50000 days | Status: AC
Start: 2020-06-12 — End: 2021-06-12

## 2020-06-12 NOTE — Progress Notes (Signed)
Melanie Miller is a 39 y.o. female who presents today for to establish care, patient was referred by Gayleen Orem, NP from [facility] Outpatient clinic for lesions in her groin,submammary and bilateral axillae. She states that she has had these for years and was treated with oral antibiotics. 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 She states that they are not very painful but they do drain pretty often.   She also has three lesions on the left side of her nose. She states that they have been present for about a year and the one on the nasal alar rim has gotten bigger over the past year. None of them are tender nor have bled. No treatments have been done for them.     BP: 120/80 / Pulse: 71 / SpO2: 98 % / Temp: 37 ?C (98.6 ?F)        Health Maintenance:  Health Maintenance Due   Topic Date Due   . HIV Screening  Never done   . Hepatitis C Screening  Never done   . Diabetes Screening  Never done   . Influenza Vaccine (1) 04/05/2020           Ambulatory Nursing Form

## 2020-06-12 NOTE — Patient Instructions (Addendum)
-   Prescription: Start Clindamycin 1% lotion twice daily. Discussed side effects including antibiotic resistance and irritation.      - Medication: Advised patient to start OTC Chlorhexidine washes daily in the shower     Biopsy Wound Care Instructions     ? Please keep the wound dry for the first 24 hours  ? After 24 hours, please remove the original bandage  ? Gently cleanse the area with mild soap and water  ? Apply a small amount Vaseline or Aquaphor to the biopsy site.  ? Cover with a Band-Aid or a nonstick gauze with tape.  ? Please continue with this wound care until the sutures are removed or the wound is healed.    It is advised that you avoid any strenuous activities that may affect the surgical site for 2 weeks.    Note:  If the area becomes red, swollen, painful or there is a noticeable amount of discharge, please call the office at [phone].  You will be notified of your results within 2 weeks.  NO NEWS IS NO NEWS.  If you have not been advised of your results, please call the office at [phone]    Legrand Pitts, PA-C    Lab Orders  Office use only:  Date: 06/12/2020  Time: 9:04 AM  User Initials: KMP     Additional Info:   Patient Information:  []  Fasting [x]  Non-fasting lab orders.  When?: ASAP

## 2020-06-12 NOTE — Progress Notes (Signed)
Roneisha Stern is a 39 y.o. female who presents today to establish care, patient was referred by Gayleen Orem, NP from [facility] Outpatient clinic for .    Personal history of   Family history of   No other new/changing moles noticed/reported.   No other skin lesions that are tender or bleeding.       /   /   /      Screening:   Depression:   Substance Use:   Fall Screen: N/A  Cognitive Assessment:         No exam data present      Health Maintenance:  Health Maintenance Due   Topic Date Due   . HIV Screening  Never done   . Hepatitis C Screening  Never done   . Diabetes Screening  Never done   . Influenza Vaccine (1) 04/05/2020           Ambulatory Nursing Form

## 2020-06-12 NOTE — Progress Notes (Signed)
[facility] Patient Progress Note     Patient Name: Melanie Miller  Date of Birth: 04/28/1981   Referring Provider: Gayleen Orem, Np  [address] [place],  [place] [address]     Date: 06/12/2020       Chief Complaint:   Chief Complaint   Patient presents with   . Skin Lesion          History of Present Illness   HPI  Melanie Miller is a 39 y.o. female who presents today for to establish care, patient was referred by Gayleen Orem, NP from [facility] clinic for lesions in her groin,submammary and bilateral axillae. She states that she has had these for years and was treated with oral antibiotics. 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 She states that they are not very painful but they do drain pretty often.     She also has three lesions on the left side of her nose. She states that they have been present for about a year and the one on the nasal alar rim has gotten bigger over the past year. None of them are tender nor have bled. No treatments have been done for them.     No other new/changing moles noticed/reported   No other skin lesions that are tender or bleeding.    Social History     Tobacco Use   . Smoking status: Former Smoker     Packs/day: 1.00     Types: Cigarettes     Quit date: 2008     Years since quitting: 13.8   . Smokeless tobacco: Never Used   . Tobacco comment: medical cannabus   Substance Use Topics   . Alcohol use: Not Currently       Past Medical History:   Diagnosis Date   . Anemia    . Anxiety    . Asthma        Past Surgical History:   Procedure Laterality Date   . HYSTERECTOMY     . OOPHORECTOMY     . PROCEDURE N/A 12/28/2019    Procedure: FLEXIBLE COLONOSCOPY; REMOVAL OF POLYP WITH SNARE;  Surgeon: Meliton Rattan, DO;  Location: [facility] END[facility];  Service: Endoscopy;  Laterality: N/A;       Family History   Adopted: Yes        Review of Systems   Review of Systems   Constitutional: Negative for activity  change, appetite change, chills, diaphoresis, fatigue, fever and unexpected weight change.   HENT: Negative for congestion, dental problem, ear pain, facial swelling, hearing loss, mouth sores, nosebleeds, sore throat, trouble swallowing and voice change.    Eyes: Negative for photophobia, pain, discharge, redness, itching and visual disturbance.   Respiratory: Negative for cough, chest tightness, shortness of breath and wheezing.    Cardiovascular: Negative for chest pain, palpitations and leg swelling.   Gastrointestinal: Negative for abdominal pain, anal bleeding, blood in stool, constipation, diarrhea, nausea, rectal pain and vomiting.   Endocrine: Negative for cold intolerance, heat intolerance, polydipsia, polyphagia and polyuria.   Genitourinary: Negative for difficulty urinating, dysuria, enuresis, frequency, genital sores, hematuria, pelvic pain, urgency, vaginal bleeding, vaginal discharge and vaginal pain.   Musculoskeletal: Positive for back pain. Negative for arthralgias, gait problem, joint swelling, myalgias and neck stiffness.   Skin: Positive for wound. Negative for color change, pallor and rash.  Negative other than HPI   Allergic/Immunologic: Negative for environmental allergies, food allergies and immunocompromised state.   Hematological: Negative for adenopathy. Does not bruise/bleed easily.   Psychiatric/Behavioral: Negative for agitation, dysphoric mood, hallucinations, self-injury and sleep disturbance. The patient is not nervous/anxious and is not hyperactive.          Physical Exam   BP 120/80 (BP Location: Right arm, Patient Position: Sitting)   Pulse 71   Temp 37 ?C (98.6 ?F) (Temporal)   Ht 5' 5 (1.651 m)   Wt 240 lb (108.9 kg)   SpO2 98%   BMI 39.94 kg/m?   DERMATOLOGY PHYSICAL EXAM      Well developed, well-nourished 39 y.o. female, Alert/oriented x 3. Normocephalic, atraumatic. Affect normal. Extraocular muscles are intact . Conjunctivae and sclerae are normal. Dentition  is normal. Breathing is unlabored. Gait normal.   ?  Physical exam of the skin performed on the following areas; scalp, face, conjunctivae/sclerae, lips, neck, ears, chest, back, abdomen, buttocks, groin, upper/lower extremities, including digits and nails. Patient declined exam of the genitalia today.            Assessment & Plan     1. Hidradenitis Suppurativa  - Firm, tender, erythematous to dark brown papulonodules, sinus tracts and associated scarring including double headed comedones present in the groin, bilateral underarm, left submammary   - Hurley Stage: ll/lll  - Discussed that this is a chronic destructive inflammatory disorder of the apocrine glands. Discussed that lesions are often sterile but can have superimposed bacterial infection. If this occurs, there may be foul smell, colored exudate. Treatment may be indicated for this.   - Discussed that shaving and other mechanical irritation can worsen this condition.   - Discussed association with cigarette smoking, obesity, metabolic syndrome, and Crohn's disease   - Prescription: Start Clindamycin 1% lotion twice daily. Discussed side effects including antibiotic resistance and irritation.    - Medication: Advised patient to start OTC Chlorhexidine washes daily in the shower   - Patient is not a candidate for doxycycline or tetracyclines in general due to history of doxy induced idiopathic intracranial hypertension   - Prescription for Humira: Initial: 160 mg (given on day 1 or split and given over 2 consecutive days), then 80 mg 2 weeks later (day 15). Maintenance: 40 mg every week or 80 mg every other week beginning day 29.  - Labs: quantiferon, CBC/CMP, chronic hepatitis panel, orders placed today  -Discussed risks, benefits, and alternatives of with the patient, who expressed understanding and agreed with the treatment plan. Side effects including but not limited to headache, nausea, URI, infections, injection site reactions, lupus erythematosus,  fatigue, rash, aplastic anemia, multiple sclerosis/other demyelinating disease, higher risk of skin cancers, and increased risk of lymphoma were discussed.     -Patient denies: Active infection, history of heart failure, signs/symptoms of malignancy including fevers, chills, weight loss, and history of lupus like syndrome.   -Patient advised to stop the medication and call the office immediately if any side effects should occur.  -Discussed that patient should advise all other health care providers that they are taking this medication to avoid drug-drug interactions and other contraindications to this medication.  -Discussed that patient should not receive live vaccinations while on this medication   -The patient expressed their understanding of the above instructions and discussion.  -Patient to come in for counseling on Humira and for first injection and any subsequent injections if patient so desires to be administered by our  clinical staff.  -RTC in 1 month or sooner as needed, will review medication and give first dose at clinic. Patient instructed not to start medication until then.       2. Neoplasm of uncertain behavior of skin   - 0.4 x 0.4 cm flesh colored papule on the left nasal ala   - cyst vs. fibrous papule rule out other     Skin Biopsy:  Procedure was discussed with the patient and she agrees to proceed. Written consent obtained.     Specimen A  SITE:  left nasal ala      SIZE:   0.4 x 0.4 cm  Method: shave  Tool: razor  Prep: After obtaining informed consent, the area was cleaned with alcohol.   Anesthesia: 1 ml of lidocaine 1% with epinephrine  Blood loss: negligible  Dressing with petrolatum ointment, gauze and bandage applied. Wound care instructions were discussed w/ patient.  Clinical Impression: cyst vs. fibrous papule rule out other   Specimen sent to [facility]      3. High risk medication use   - Prescription for Humira: Initial: 160 mg (given on day 1 or split and given over  2 consecutive days), then 80 mg 2 weeks later (day 15). Maintenance: 40 mg every week or 80 mg every other week beginning day 29.  - Labs: quantiferon, CBC/CMP, chronic hepatitis panel, orders placed today  -Discussed risks, benefits, and alternatives of with the patient, who expressed understanding and agreed with the treatment plan. Side effects including but not limited to headache, nausea, URI, infections, injection site reactions, lupus erythematosus, fatigue, rash, aplastic anemia, multiple sclerosis/other demyelinating disease, higher risk of skin cancers, and increased risk of lymphoma were discussed.     -Patient denies: Active infection, history of heart failure, signs/symptoms of malignancy including fevers, chills, weight loss, and history of lupus like syndrome.   -Patient advised to stop the medication and call the office immediately if any side effects should occur.  -Discussed that patient should advise all other health care providers that they are taking this medication to avoid drug-drug interactions and other contraindications to this medication.  -Discussed that patient should not receive live vaccinations while on this medication   -The patient expressed their understanding of the above instructions and discussion.  -Patient to come in for counseling on Humira and for first injection and any subsequent injections if patient so desires to be administered by our clinical staff.  -RTC in 1 month or sooner as needed, will review medication and give first dose at clinic. Patient instructed not to start medication until then.           Diagnosis,Treatment, and follow up discussed at length. Pt verbalized understanding.    I, Debbora Presto, MA, acted as Neurosurgeon for [employer], PA-C    I, [employer], personally performed the services described in this documentation, as scribed by [place] [employer] in my presence, and it is both accurate and complete.    Return in about 1 month (around 07/12/2020), or if  symptoms worsen or fail to improve, for HS.

## 2020-06-13 MED ORDER — lidocaine-EPINEPHrine 1 %-1:200,000 injection 1 mL
1 | Freq: Once | INTRAMUSCULAR | Status: AC
Start: 2020-06-13 — End: 2020-06-12
  Administered 2020-06-12: 17:00:00 1 mL via INTRADERMAL

## 2020-06-14 NOTE — Telephone Encounter (Addendum)
Rx clarification request. Placed in [place], PA-C in basket.    Dyann Ruddle, CMA

## 2020-06-15 MED ORDER — adalimumab 80 mg/0.8 mL-40 mg/0.4 mL PnKt
80 | Freq: Once | SUBCUTANEOUS | 0 refills | 28.00000 days | Status: AC
Start: 2020-06-15 — End: 2020-06-15

## 2020-06-21 NOTE — Telephone Encounter (Signed)
Faxed 06/20/20 with confirmation received.    Dyann Ruddle, CMA

## 2020-06-28 NOTE — Telephone Encounter (Signed)
Incoming fax received from: [facility] placed in [place] basket for review.  Regarding: Adalimumab injection  Note: Prior Authorization

## 2020-07-04 NOTE — Telephone Encounter (Signed)
Patient called again with concerns about her Humira Pen prescription. She contacted the [facility] who stated that they had not received auth from our clinic. She also stated that she had concerns regarding lab results from her PCP. MA Laurelyn Sickle was able to speak to her regarding these concerns. I will forward this message to [facility] MA staff.     CB

## 2020-07-04 NOTE — Telephone Encounter (Signed)
Melanie Miller left a voicemail asking for a call back from the MA. She states there is some confusion about her prescription and would like a call back to confirm before anything is done. Please call [phone]

## 2020-07-04 NOTE — Telephone Encounter (Signed)
Called patient to discuss. Spoke with Legrand Pitts, PA-C about this. Patient was advised to continue her work up with her provider and complete any tests that she needs to have done. Let patient know that we are unable to know if their will be ay contraindications for her Humira use until we know what medications her provider is wanting to start her on. Patient was advised to discuss this with her provider and give [place] a call and let us know if Humia is something she would like to pursue. Patient verbalized understanding.     Laurelyn Sickle Sandara Tyree, MA    No future appointments.

## 2020-07-05 NOTE — Telephone Encounter (Signed)
Patient is requesting Cortizone injection for her back, states GO approved this at her last appointment.

## 2020-07-10 ENCOUNTER — Encounter: Attending: PA

## 2020-07-11 NOTE — Telephone Encounter (Signed)
-----   Message from Tobey Bride, [place] sent at 07/07/2020 11:20 AM PST -----  Regarding: Back Injection  Called patient to schedule her SNRB, she was fine with scheduling until I told her a driver is required. At that point she became upset and abrasive and claimed that she was never told she needed a driver. She said GO told her she would be having a cortisone injection and he should have informed her that a driver was needed. She also argued that her problem isn't between L4-5, she claims it's through L1-4. She said no one has explained anything to her and she doesn't even know what's going on. She requested that his MA call. Please advise.

## 2020-07-11 NOTE — Telephone Encounter (Signed)
Called patient I explained the procedure to the patient. She asked that we let her know as soon as we know when the next pain clinic is, so she can arrange a driver.

## 2021-09-12 IMAGING — MR MRI CSPINE WO CONTRAST
5 series · 44 of 48 positions shown · non-contrast
Comparison: None.

HISTORY: 40-year-old Female with neck pain. Chronic bilateral shoulder pain.
TECHNIQUE: MRI study of the cervical spine was performed with sagittal and axial images in varying sequences.

[Series 1: bSSFP · axial · 6.0mm · 1.17mm/px · z∈[-38,+140]mm · 10 of 22 slices shown]
[im 1/22]
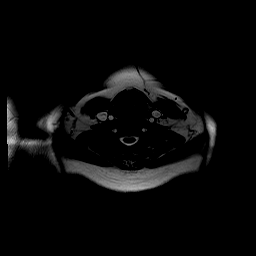
[im 3/22]
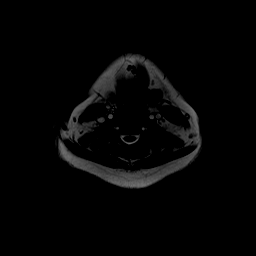
[im 5/22]
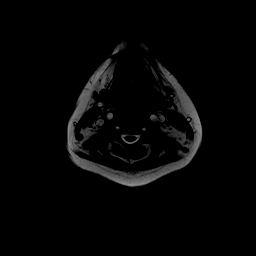
[im 8/22]
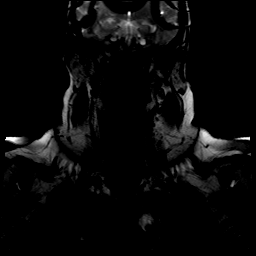
[im 10/22]
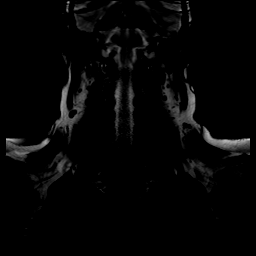
[im 12/22]
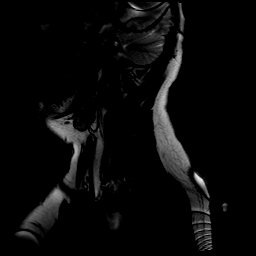
[im 15/22]
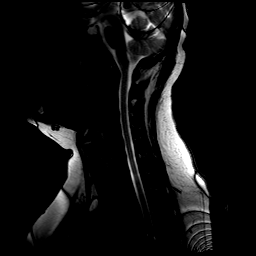
[im 17/22]
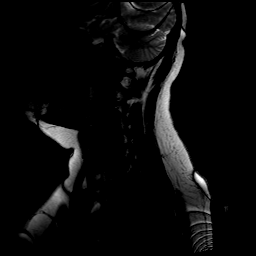
[im 19/22]
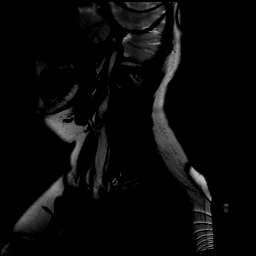
[im 22/22]
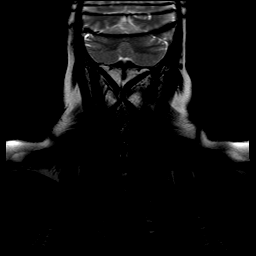

[Series 2: t2_sag · sagittal · 3.0mm · 0.69mm/px · 8 of 15 slices shown]
[im 1/15]
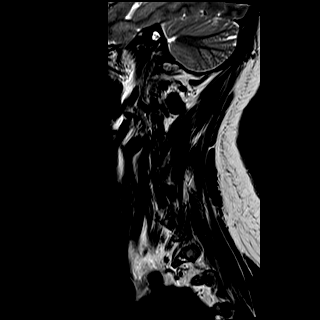
[im 3/15]
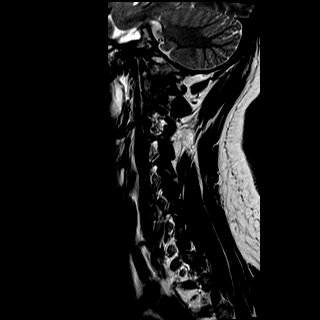
[im 5/15]
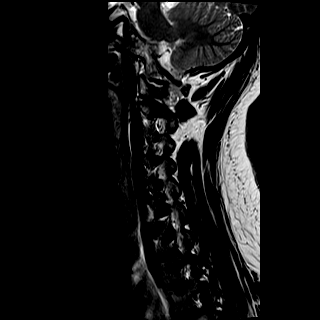
[im 7/15]
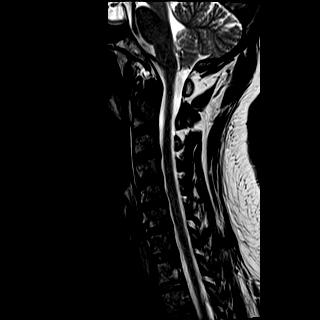
[im 9/15]
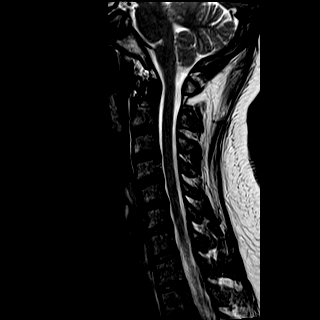
[im 11/15]
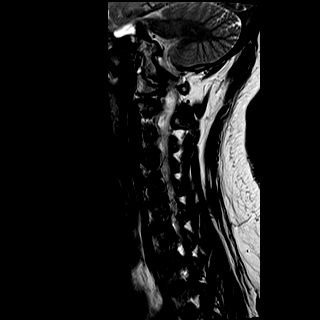
[im 13/15]
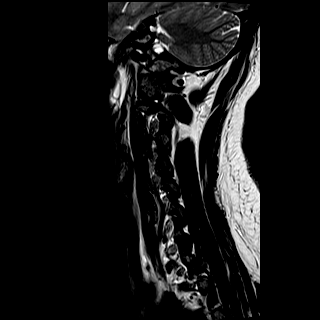
[im 15/15]
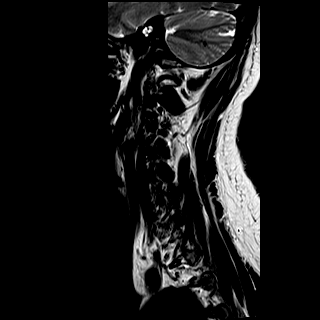

[Series 3: t1_sag · sagittal · 3.0mm · 0.69mm/px · 8 of 15 slices shown]
[im 1/15]
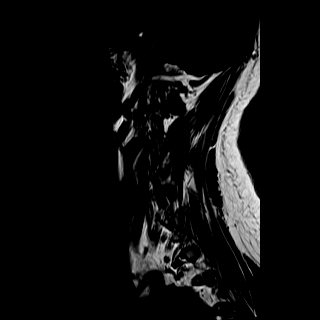
[im 3/15]
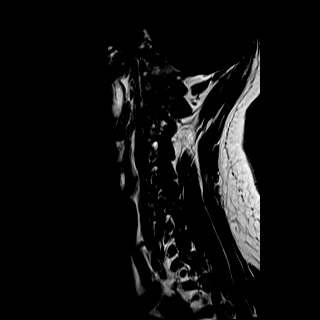
[im 5/15]
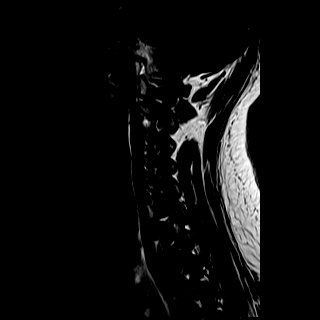
[im 7/15]
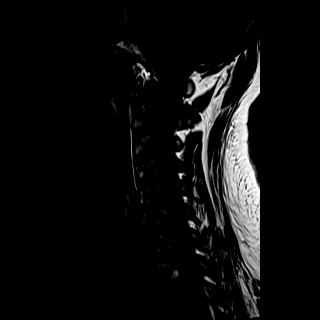
[im 9/15]
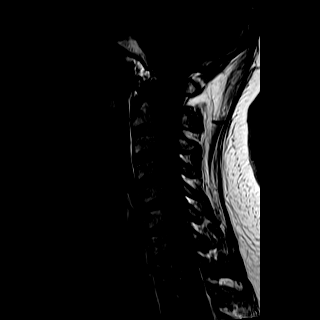
[im 11/15]
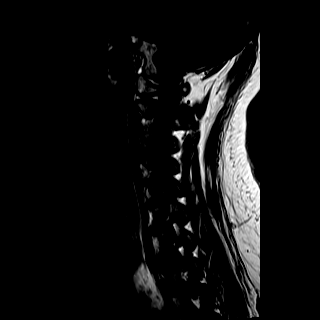
[im 13/15]
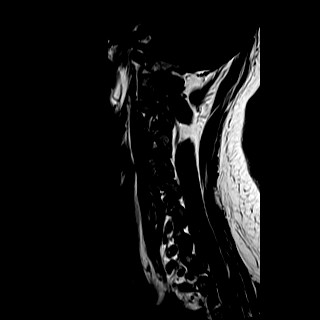
[im 15/15]
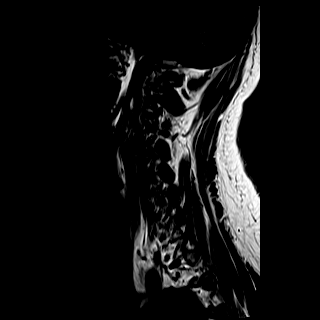

[Series 4: ir_sag · sagittal · 3.0mm · 0.86mm/px · 8 of 15 slices shown]
[im 1/15]
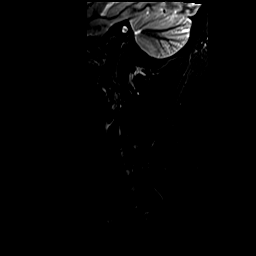
[im 3/15]
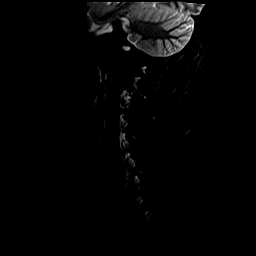
[im 5/15]
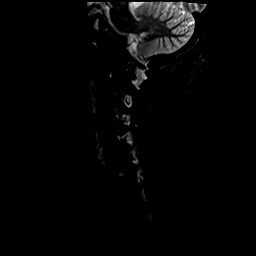
[im 7/15]
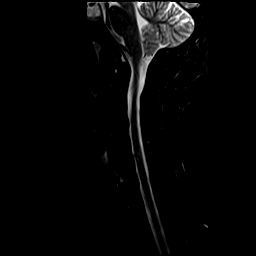
[im 9/15]
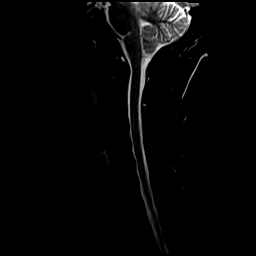
[im 11/15]
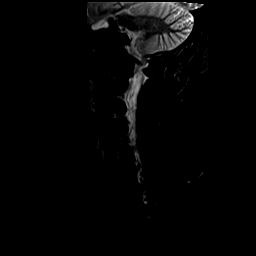
[im 13/15]
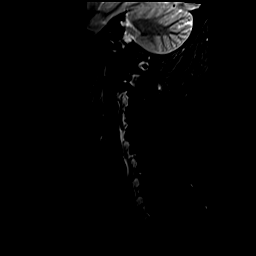
[im 15/15]
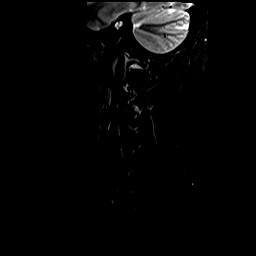

[Series 5: t2_medic_axial · axial · 3.0mm · 0.30mm/px · z∈[-84,+27]mm · 10 of 29 slices shown]
[im 1/29]
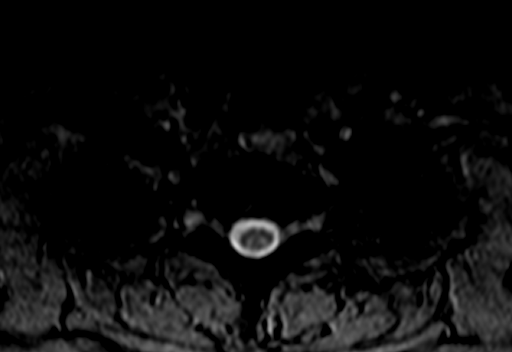
[im 3/29]
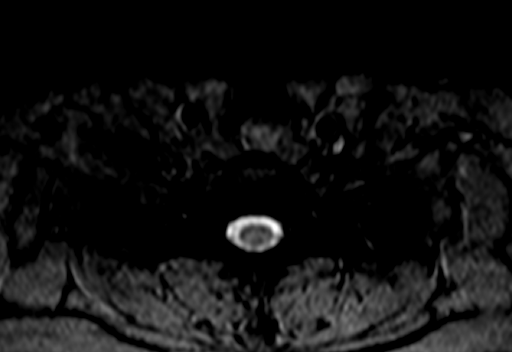
[im 5/29]
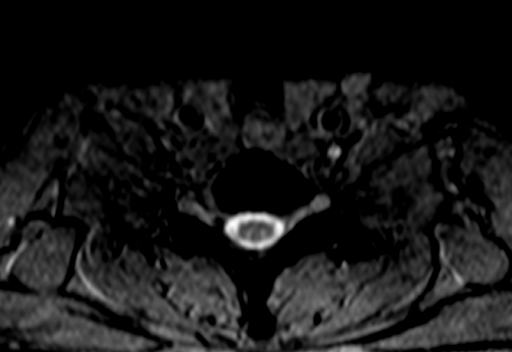
[im 7/29]
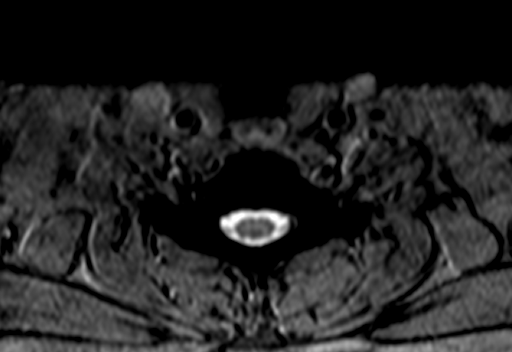
[im 9/29]
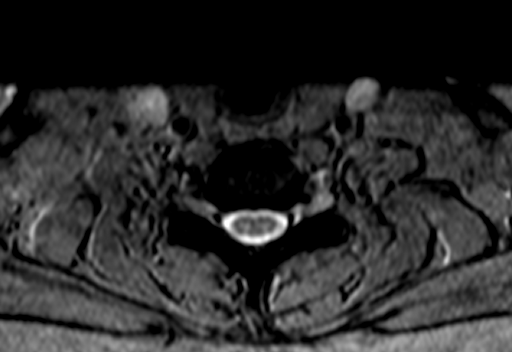
[im 13/29]
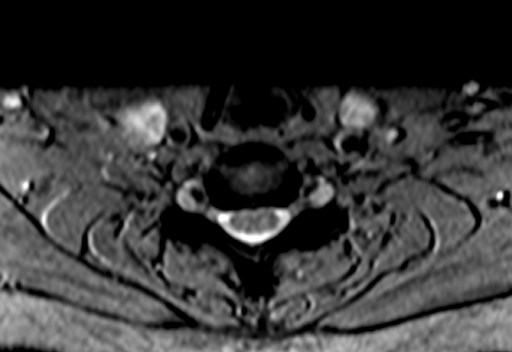
[im 16/29]
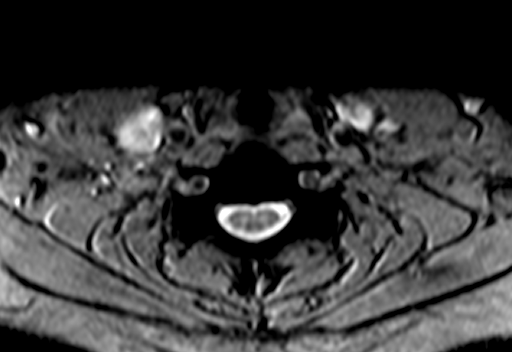
[im 20/29]
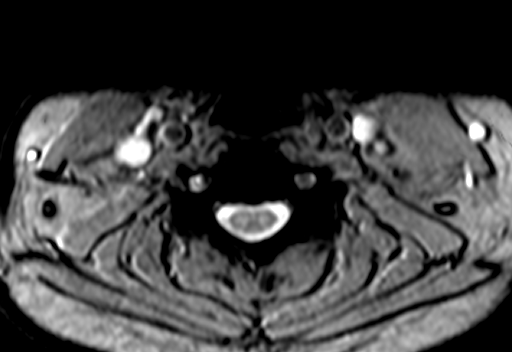
[im 24/29]
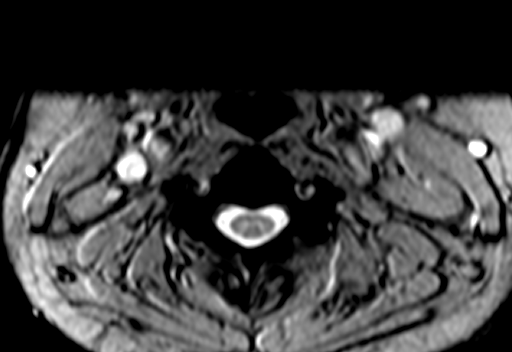
[im 29/29]
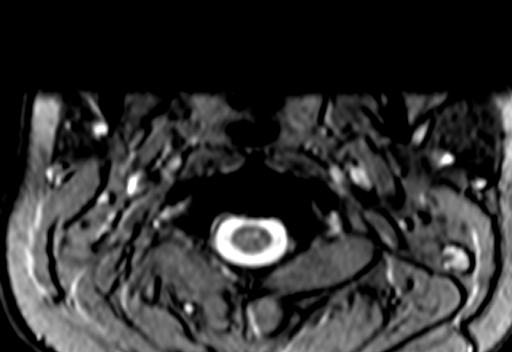

[44 of 48 positions shown; findings below may reference images not displayed]

FINDINGS: Vertebral body height and alignment: The vertebral body height and alignment are within normal limits.

Marrow signal: The marrow signal is unremarkable.

Degenerative disc changes: No significant degenerative disc changes are present.

Posterior fossa and the cervical cord demonstrate normal signal.

C2-3 level:  No spinal canal stenosis. Neural foramina are patent.

C3-4 level: No spinal canal stenosis. Neural foramina are patent.

C4-5 level: No spinal canal stenosis. Neural foramina are patent.

C5-6 level: No spinal canal stenosis. Neural foramina are patent.

C6-7 level: No spinal canal stenosis. Neural foramina are patent.

C7-T1 level: No spinal canal stenosis. Neural foramina are patent.

Incidental note is a 1.8 cm right maxillary sinus retention cyst.
IMPRESSION: 1.
No MRI evidence of severe cervical spinal canal stenosis, disc herniation or neural foramina impingement.

2.
The cervical cord demonstrates normal signal.

## 2021-12-03 IMAGING — US US BREAST BIL LTD
1 series · 14 of 25 positions shown · non-contrast
Comparison: 08-15-2021

INDICATION: Mastodynia.
TECHNIQUE: Real-time and Doppler ultrasound of both breasts was performed.

[Series 1: us breast bil ltd · 14 of 25 slices shown]
[im 1/25]
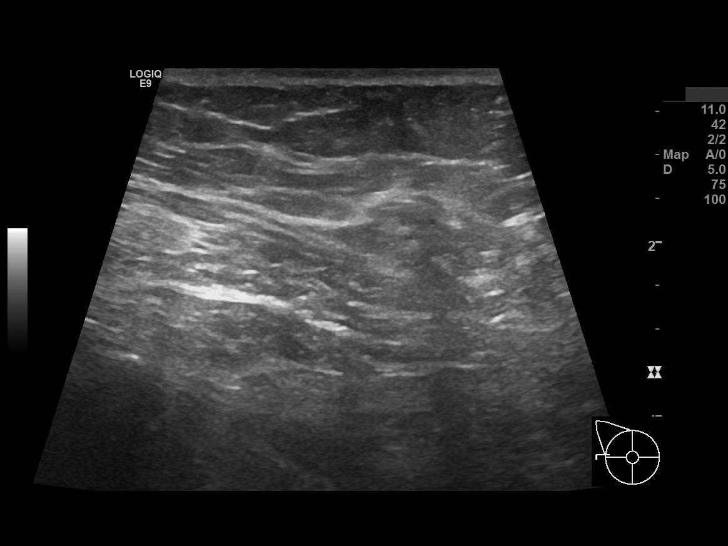
[im 3/25]
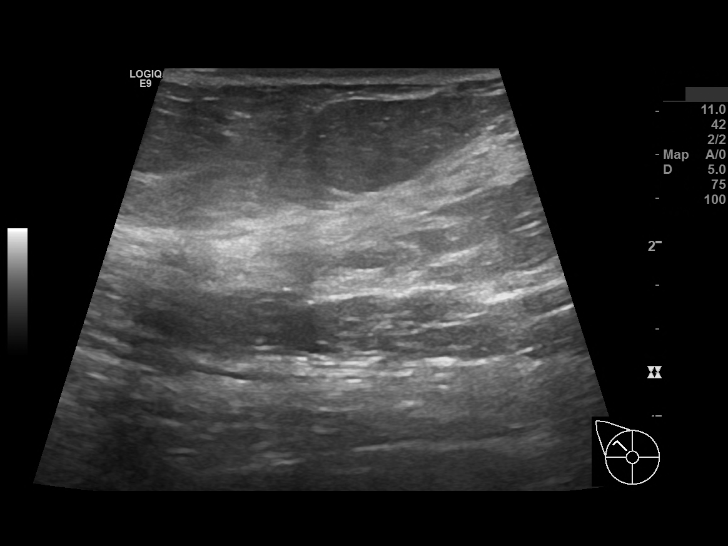
[im 5/25]
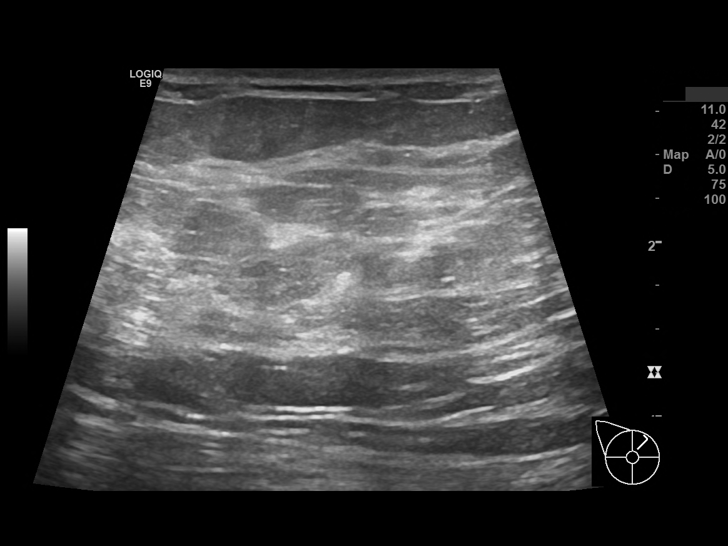
[im 7/25]
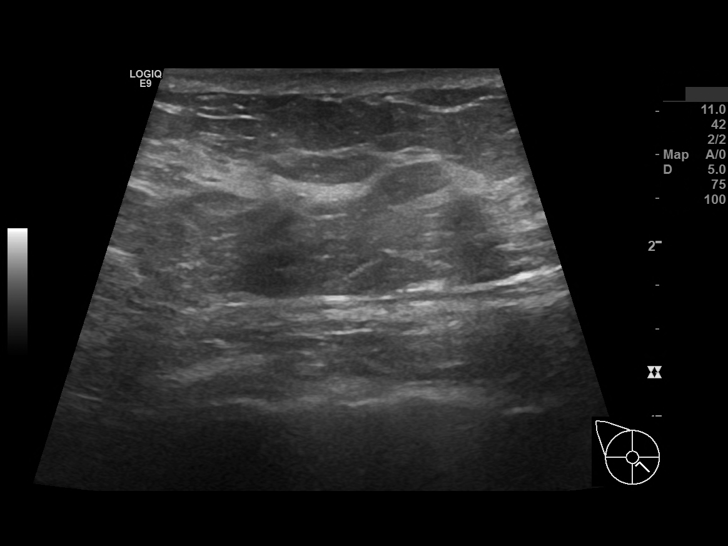
[im 9/25]
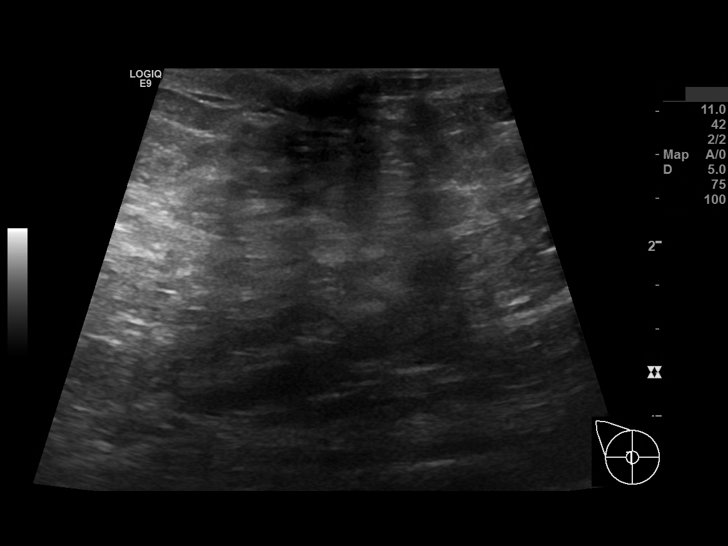
[im 10/25]
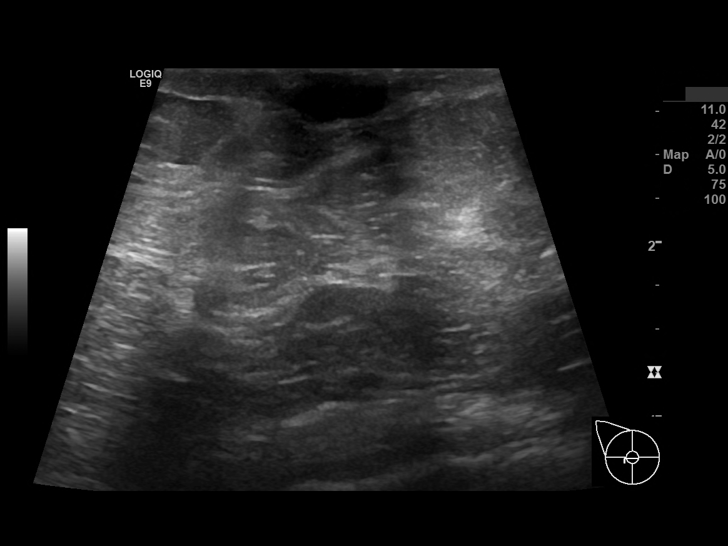
[im 12/25]
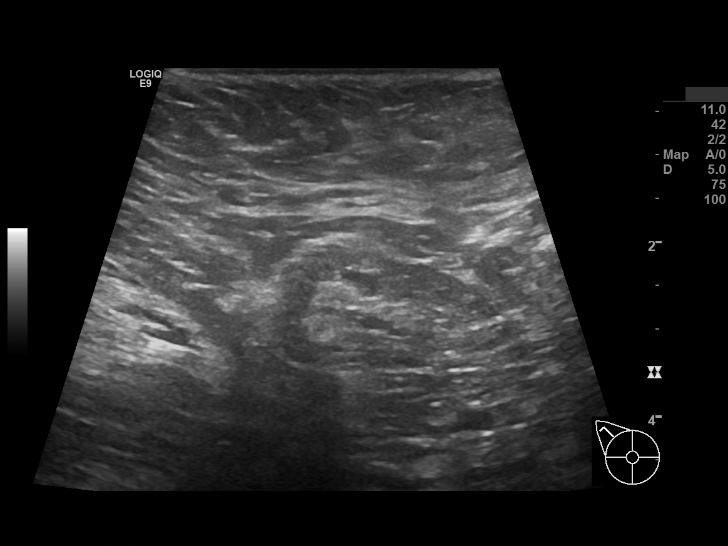
[im 14/25]
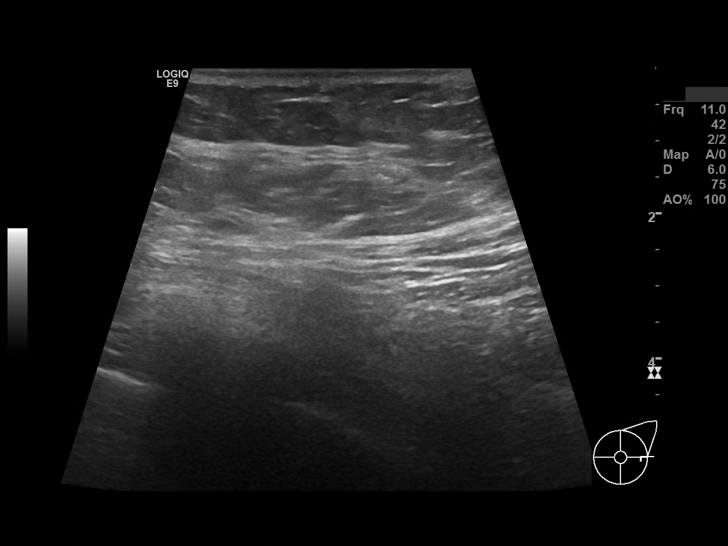
[im 16/25]
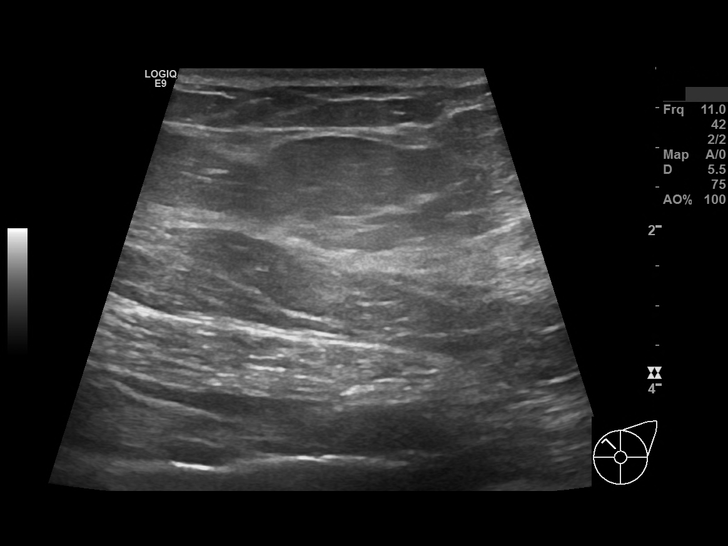
[im 17/25]
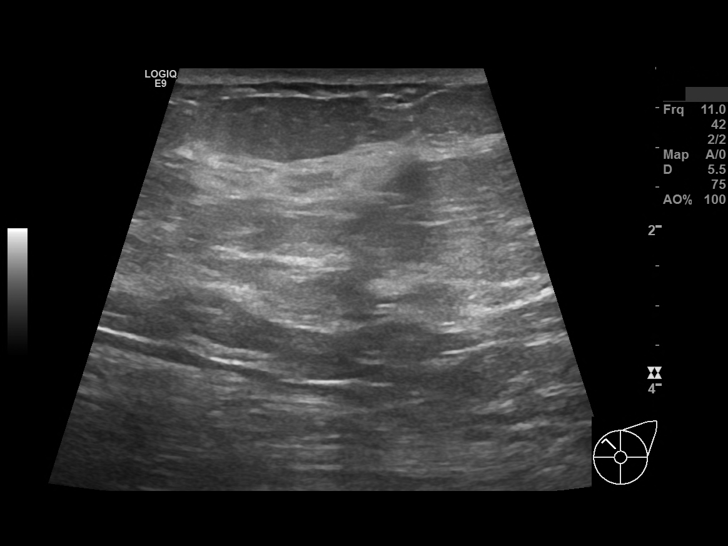
[im 19/25]
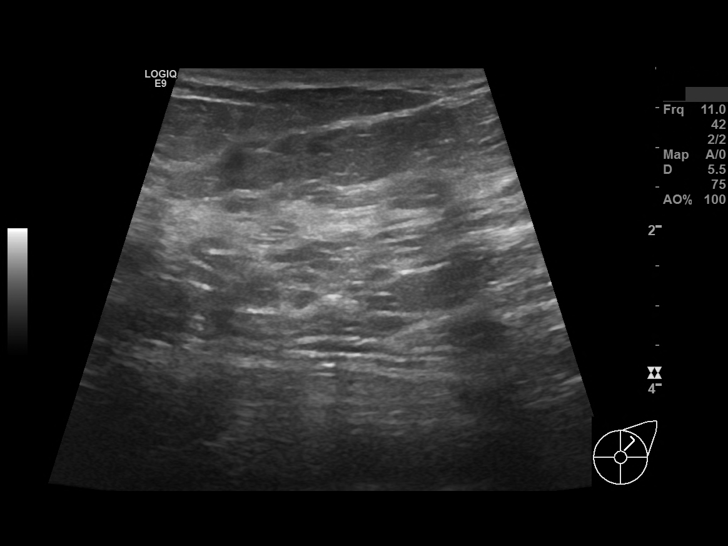
[im 21/25]
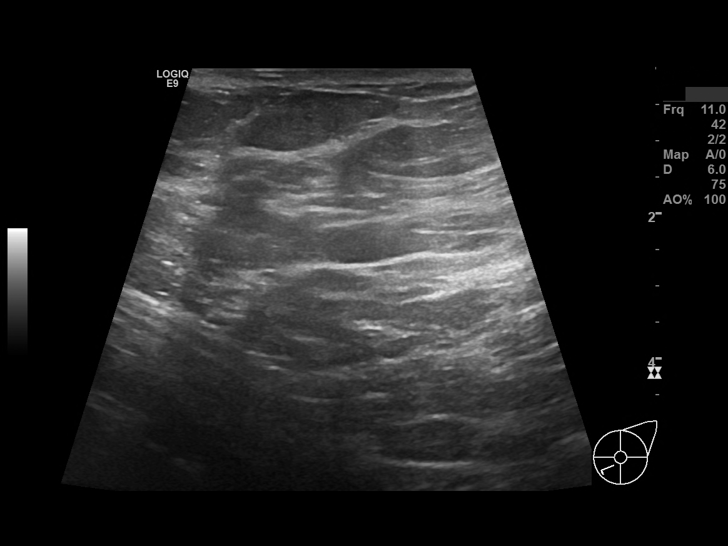
[im 23/25]
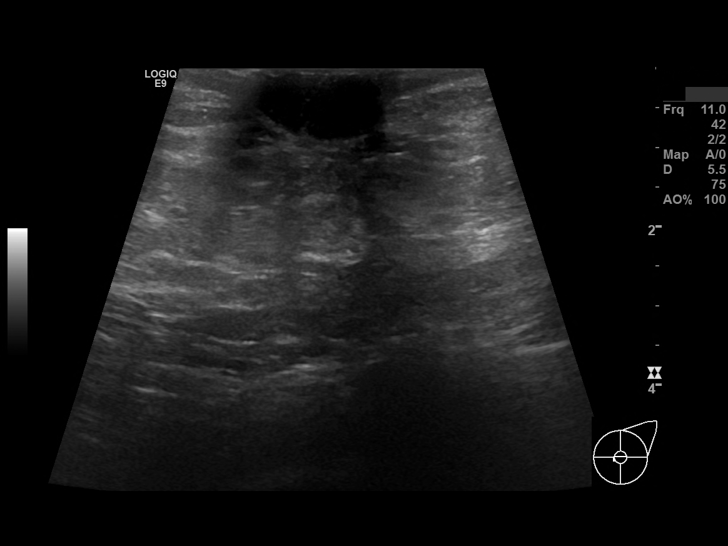
[im 25/25]
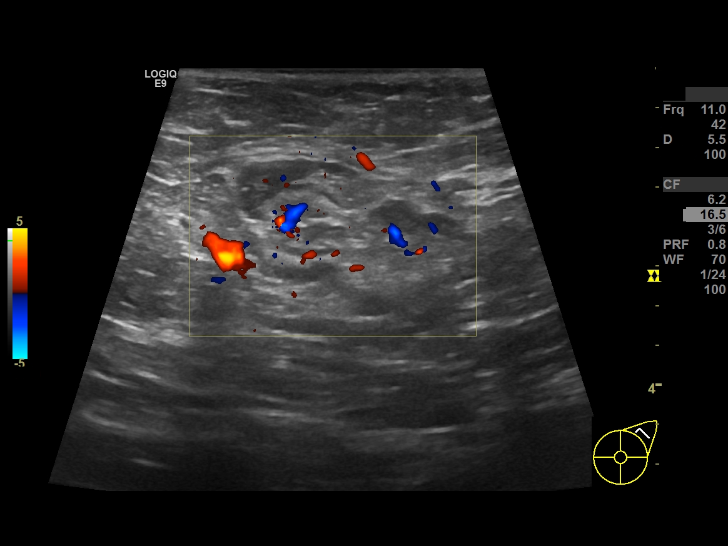

[14 of 25 positions shown; findings below may reference images not displayed]

FINDINGS: No suspicious sonographic abnormality identified in either breast in the areas of pain as indicated by the patient in the superior, lower inner, and axillary regions.
IMPRESSION: There is no sonographic evidence of malignancy.  A one year screening mammogram is recommended in August 2022.

The patient received a copy of the results at the end of the examination.

FINAL ASSESSMENT: BI-RADS: Category 1 Negative

## 2022-01-24 IMAGING — US US LIVER
1 series · 14 of 25 positions shown · non-contrast
Comparison: None.

Images Obtained from [facility]
HISTORY: 40 year-old female with abnormal results of liver function studies.
TECHNIQUE: Using real-time and Doppler ultrasound, the liver and gallbladder were evaluated.

[Series 1: us liver · 14 of 50 slices shown]
[im 1/50]
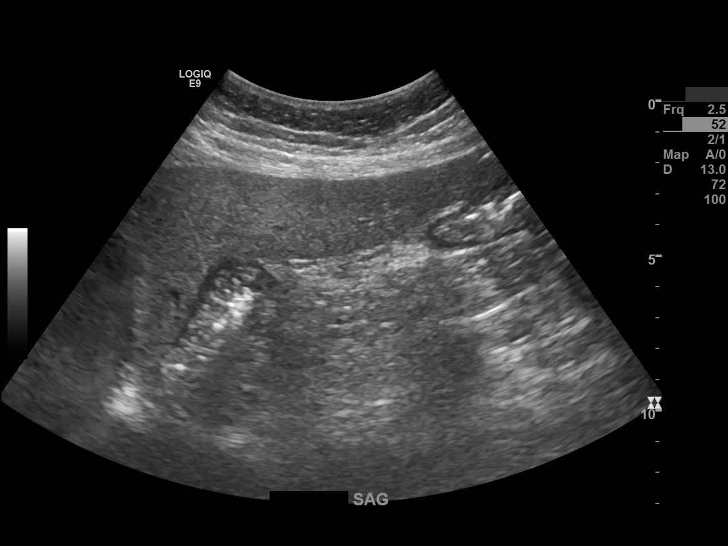
[im 5/50]
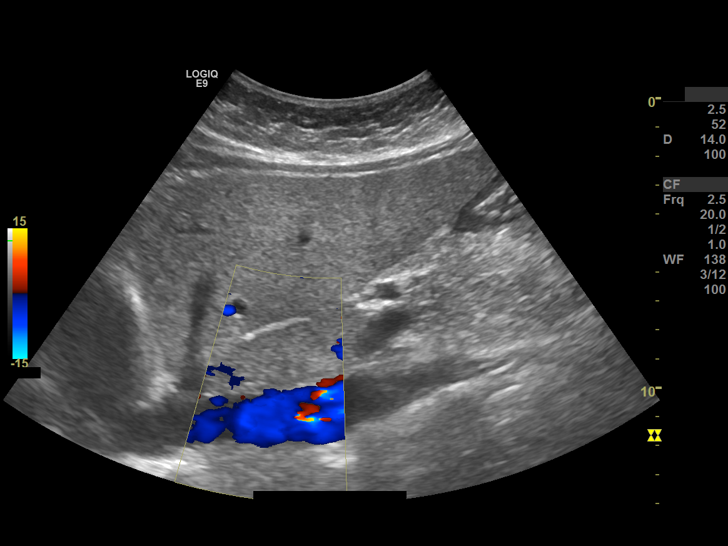
[im 9/50]
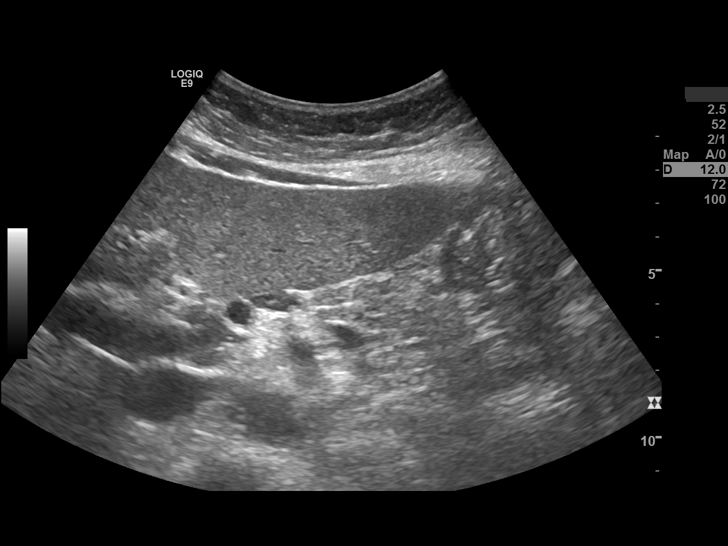
[im 13/50]
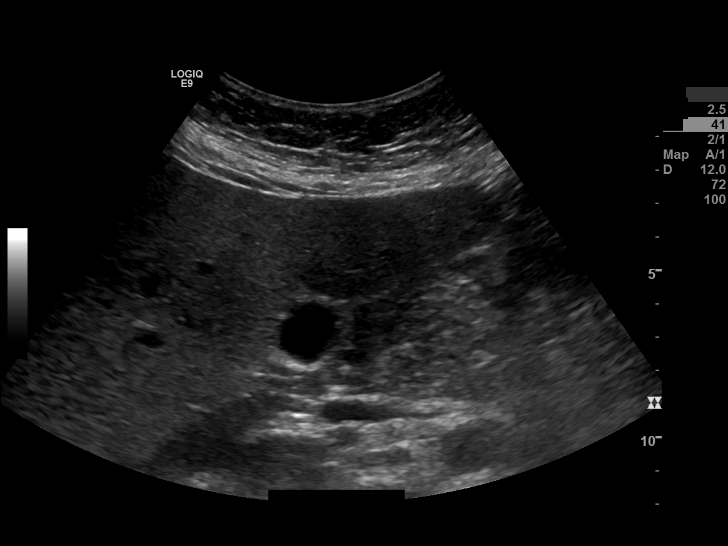
[im 17/50]
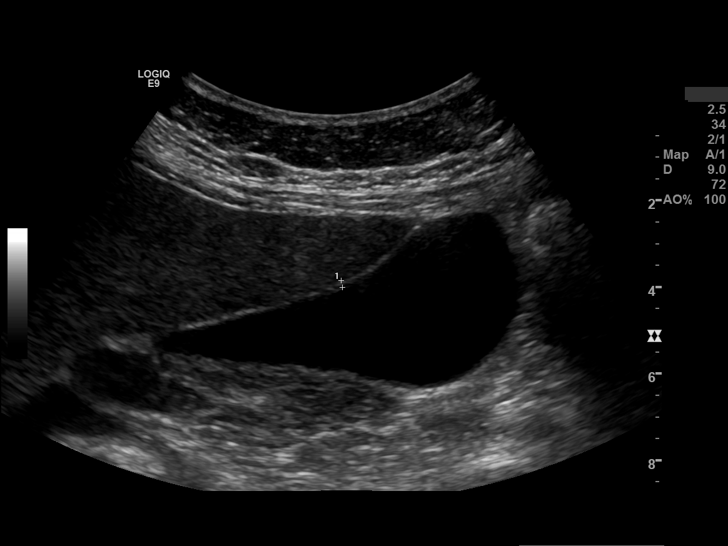
[im 19/50]
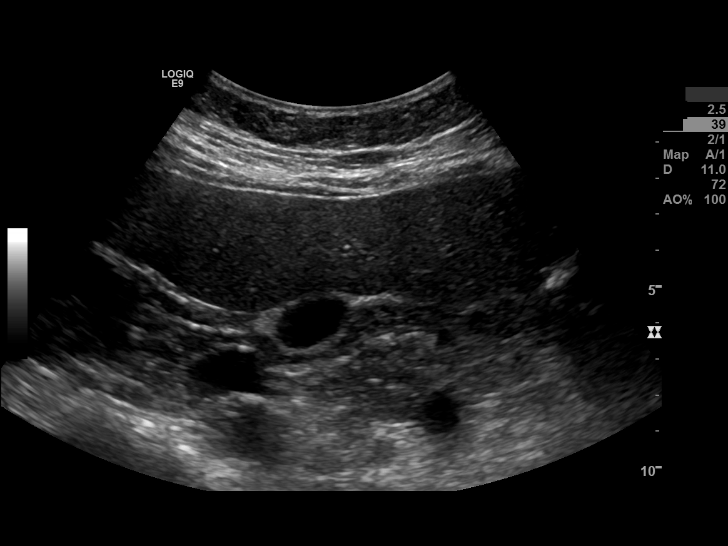
[im 23/50]
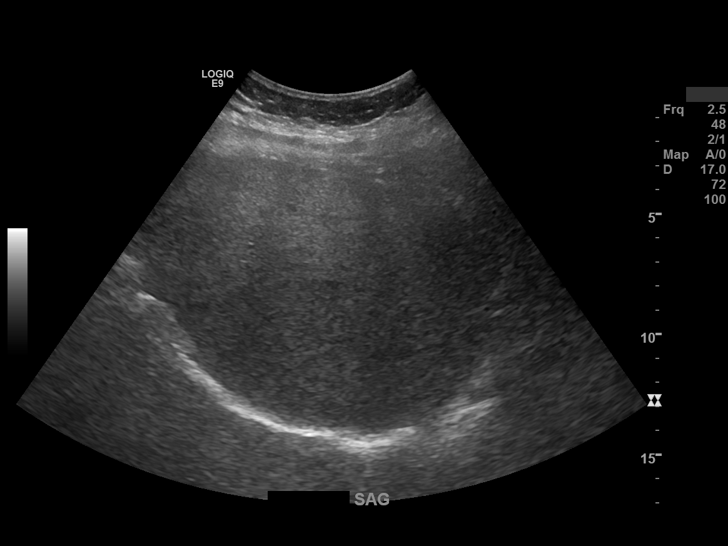
[im 27/50]
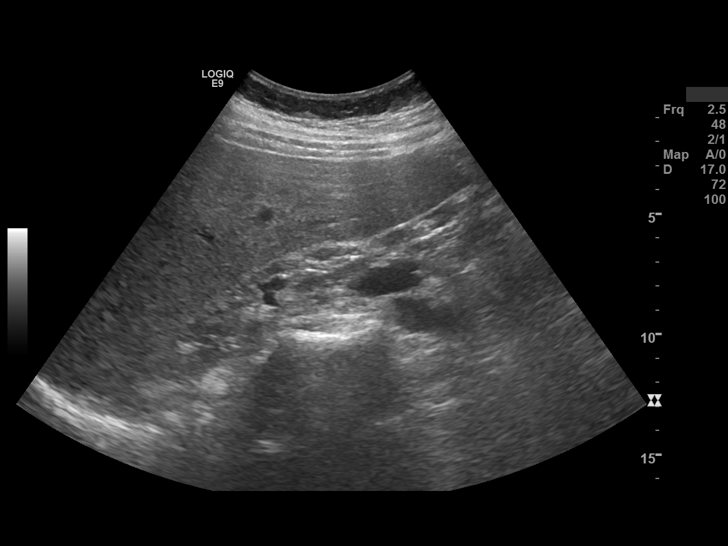
[im 31/50]
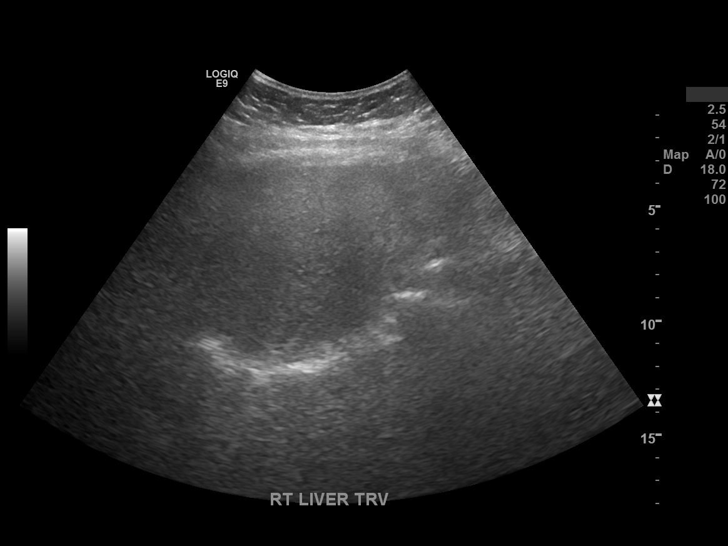
[im 33/50]
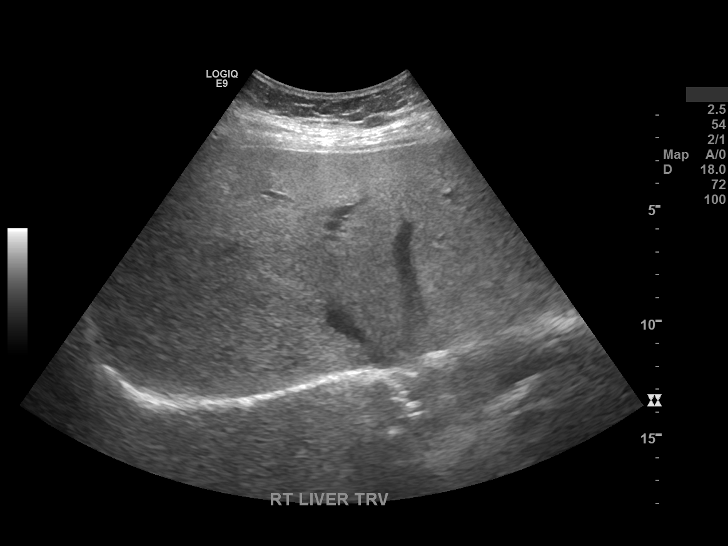
[im 37/50]
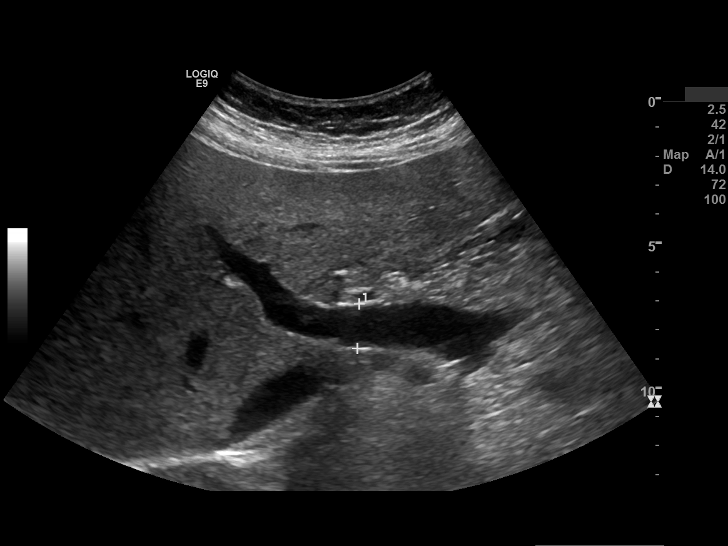
[im 41/50]
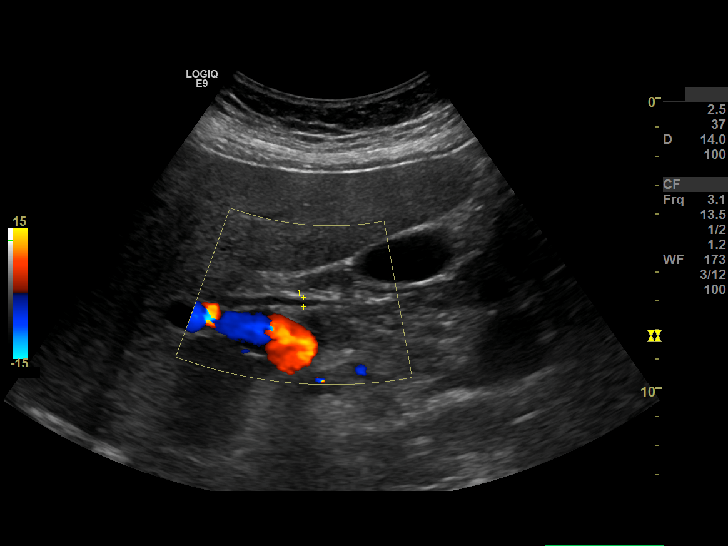
[im 45/50]
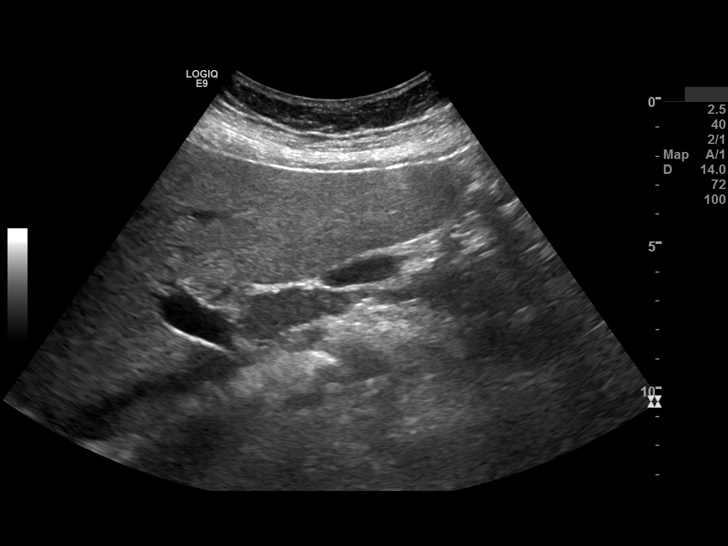
[im 50/50]
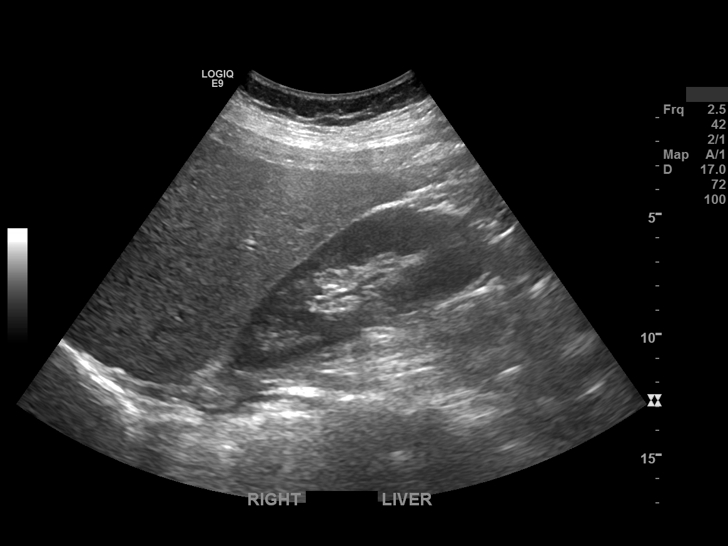

[14 of 25 positions shown; findings below may reference images not displayed]

FINDINGS: Liver: Length of  197 mm in the right midclavicular line. There is a increased echogenicity involving the liver. Hepatopetal flow is identified in the portal vein. The main portal vein diameter
measures 15.3 mm. No focal masses identified. No intrahepatic biliary ductal dilatation is seen.
Gallbladder: The gallbladder is within normal limits. The gallbladder wall thickness measures 1.6 mm. There is a negative sonographic Murphy's sign. The common bile duct measures 3.24 mm.
45 x 14 x 12 mm hypoechoic lesions identified in the porta hepatis.
IMPRESSION: 1.  Evidence for liver steatosis.
2.  45 x 14 x 12 mm hypoechoic lesions identified in the porta hepatis. Additional MRI abdomen study with liver protocol is recommended.

## 2022-06-10 IMAGING — MR MRI ABDOMEN WO/W CONTRAST
9 series · 47 of 48 positions shown · IV contrast (prohance)
Comparison: Liver ultrasound 01/24/2022

Images Obtained from [facility]
HISTORY: Liver disease, unspecified  - Abnormal findings on diagnostic imaging of liver and biliary tract
TECHNIQUE: Multiplanar multisequence MRI of the abdomen without and with contrast was performed.
Contrast dose: 20 mL ProHance

[Series 1: bSSFP · axial · 8.0mm · 1.76mm/px · 1 of 22 slices shown]
[im 1/22]
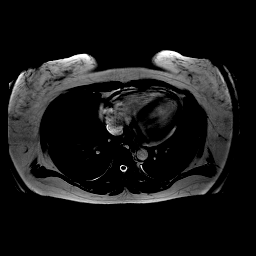

[Series 2: t2_cor_haste · coronal · 7.0mm · 0.76mm/px · 2 of 24 slices shown]
[im 1/24]
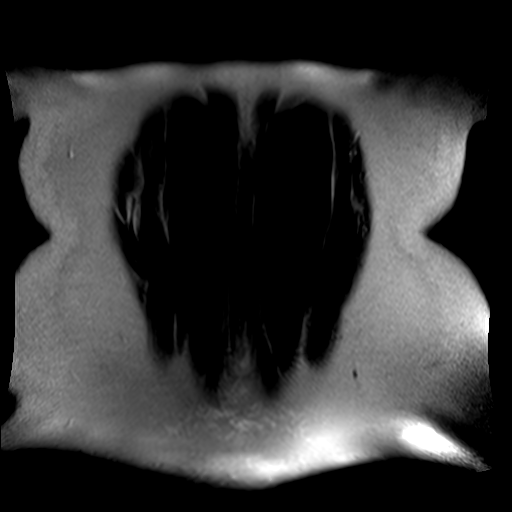
[im 24/24]
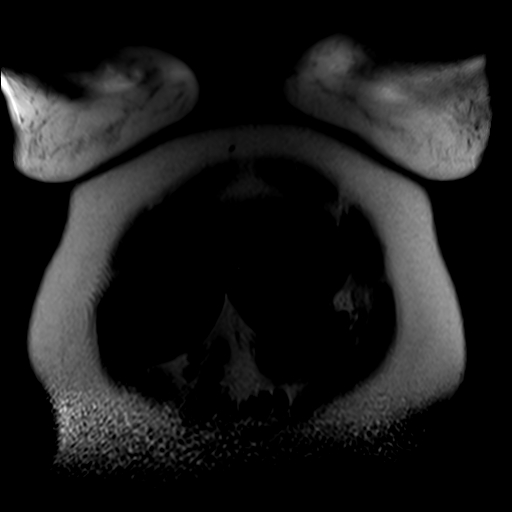

[Series 3: t2_axial_fs · axial · 7.0mm · 1.64mm/px · z∈[-198,+29]mm · 3 of 28 slices shown]
[im 1/28]
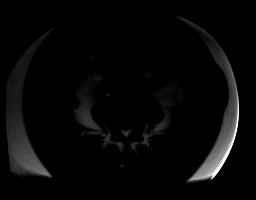
[im 14/28]
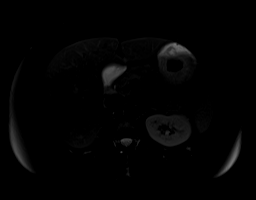
[im 28/28]
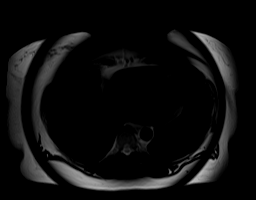

[Series 4: t1_vibe_(person_name)_axial_fs_in · axial · 3.5mm · 1.41mm/px · z∈[-208,+40]mm · 7 of 72 slices shown]
[im 1/72]
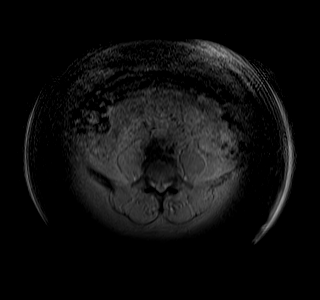
[im 12/72]
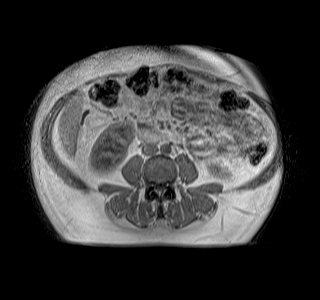
[im 24/72]
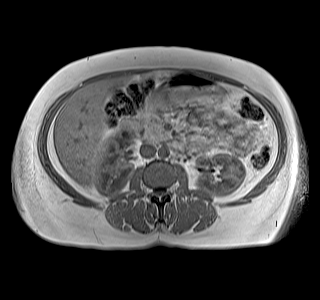
[im 36/72]
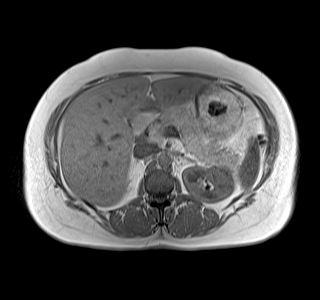
[im 48/72]
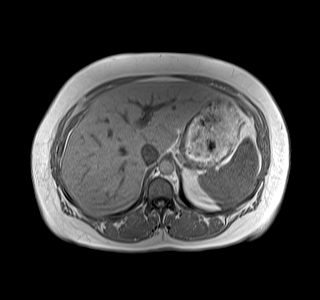
[im 60/72]
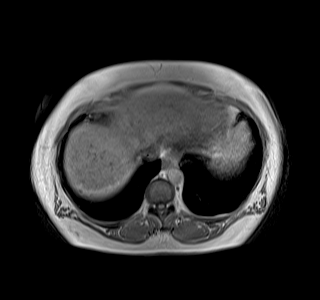
[im 72/72]
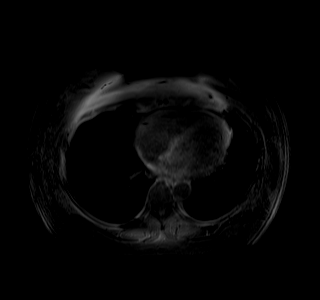

[Series 5: t1_vibe_(person_name)_axial_fs_opp · axial · 3.5mm · 1.41mm/px · z∈[-208,+40]mm · 7 of 72 slices shown]
[im 1/72]
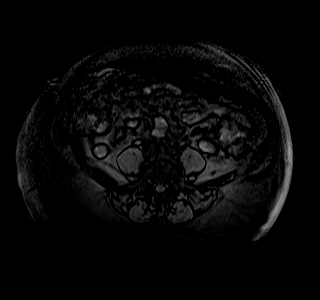
[im 12/72]
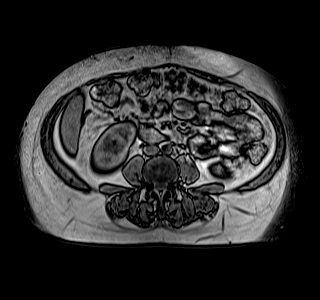
[im 24/72]
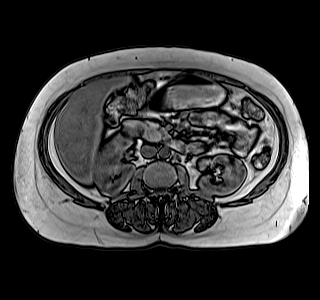
[im 36/72]
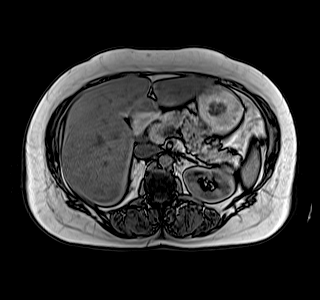
[im 48/72]
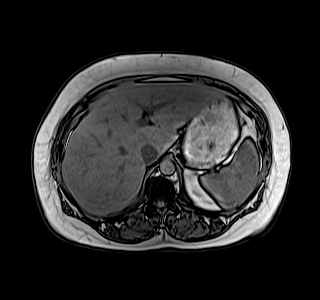
[im 60/72]
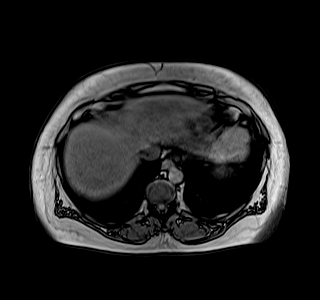
[im 72/72]
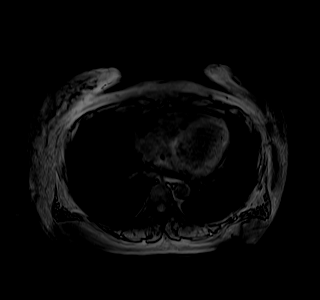

[Series 7: t1_vibe_(person_name)_axial_fs_w · axial · 3.5mm · 1.41mm/px · z∈[-208,+40]mm · 7 of 72 slices shown]
[im 1/72]
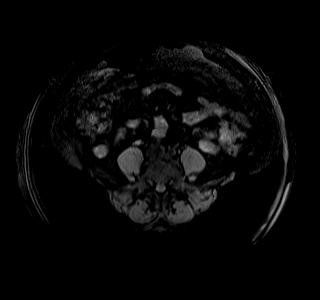
[im 12/72]
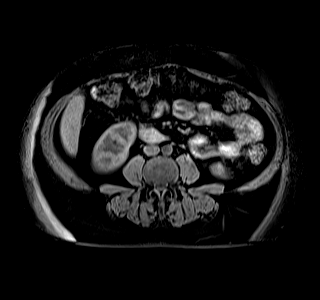
[im 24/72]
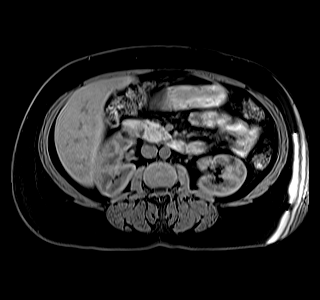
[im 36/72]
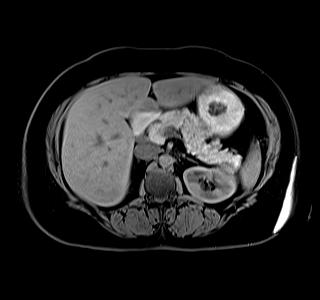
[im 48/72]
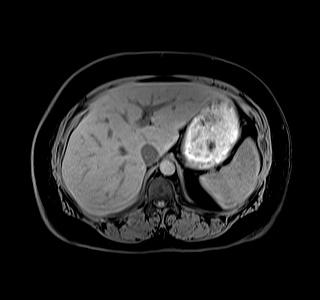
[im 60/72]
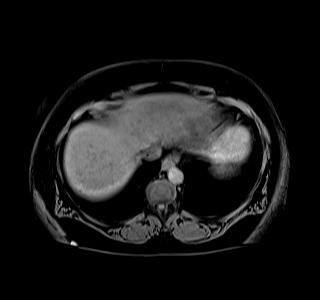
[im 72/72]
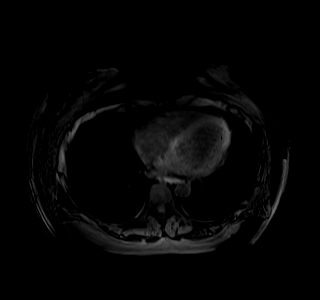

[Series 11: t1_vibe_(person_name)_axial_fs+c1_w · axial · 3.5mm · 1.41mm/px · z∈[-208,+40]mm · 7 of 72 slices shown]
[im 1/72]
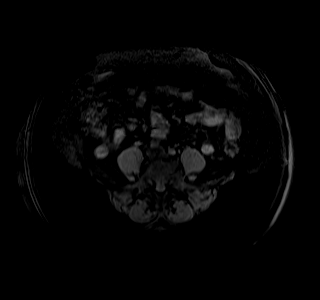
[im 12/72]
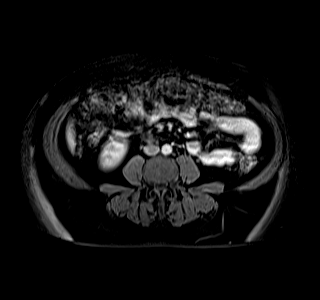
[im 24/72]
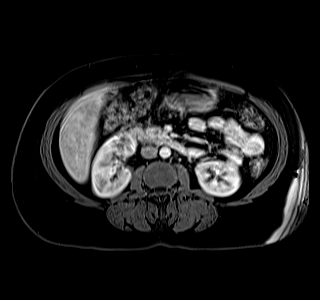
[im 36/72]
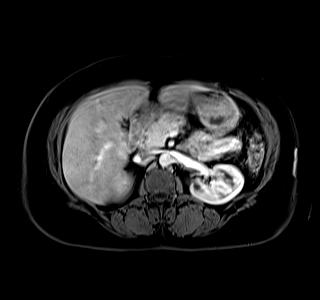
[im 48/72]
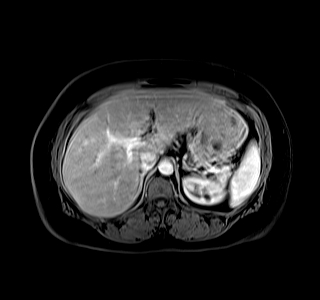
[im 60/72]
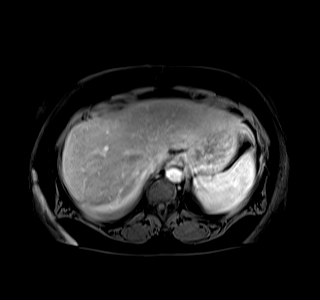
[im 72/72]
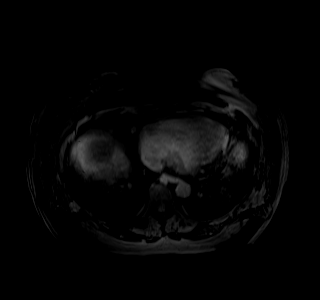

[Series 15: t1_vibe_(person_name)_axial_fs_+c2_w · axial · 3.5mm · 1.41mm/px · z∈[-208,+40]mm · 7 of 72 slices shown]
[im 1/72]
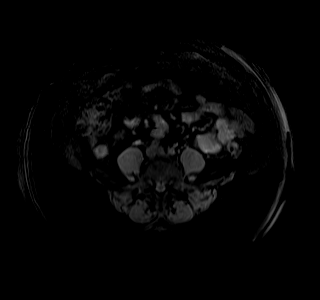
[im 12/72]
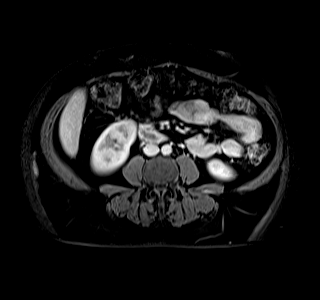
[im 24/72]
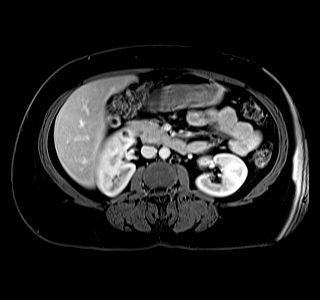
[im 36/72]
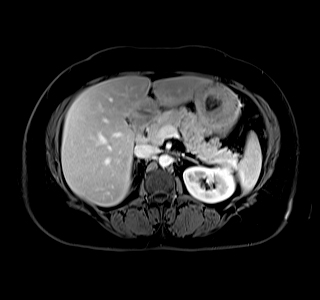
[im 48/72]
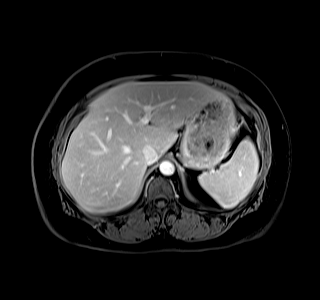
[im 60/72]
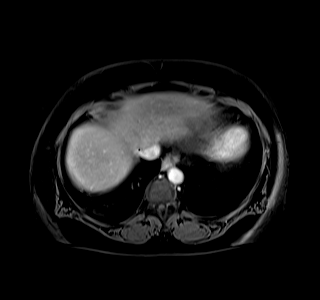
[im 72/72]
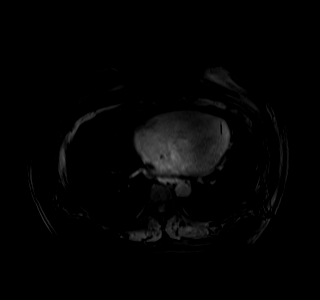

[Series 19: t1_vibe_(person_name)_axial_fs_+c3_w · axial · 3.5mm · 1.41mm/px · z∈[-208,-2]mm · 6 of 72 slices shown]
[im 1/72]
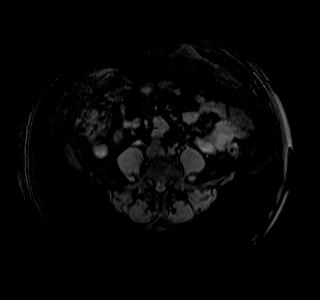
[im 12/72]
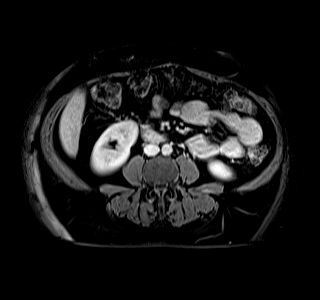
[im 24/72]
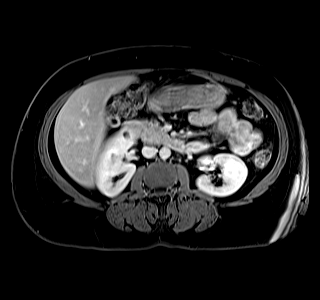
[im 36/72]
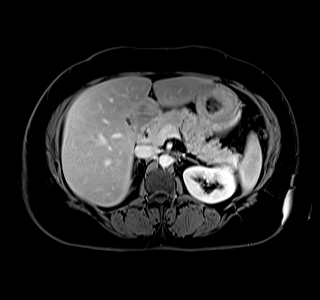
[im 48/72]
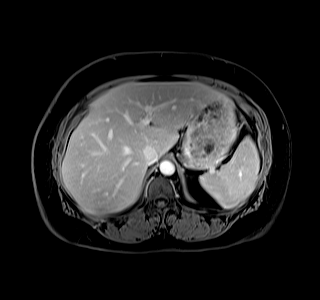
[im 60/72]
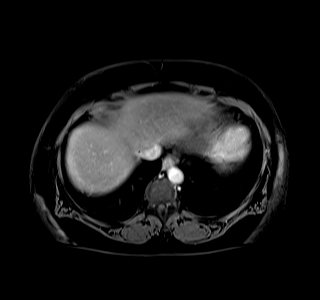

[47 of 48 positions shown; findings below may reference images not displayed]

FINDINGS: There is hepatomegaly. The liver measures approximately 21 cm in length. The liver demonstrates diffuse signal drop on the out of phase sequence indicating hepatic steatosis. No enhancing hepatic
lesion is seen. The portal vein is patent. The gallbladder is contracted. There is no biliary ductal dilatation.
There is a small portacaval lymph node measuring 1.8 x 1.3 cm ([DATE]). No abnormal pancreatic mass or periportal adenopathy is seen. Perhaps a previously imaged periportal node has decreased in size
and/or resolved.
The spleen is unremarkable. Pancreas enhances homogeneously. The right kidney is malrotated with its long axis situated anterior posterior in the abdomen. There is a small cortical cyst in the left
upper renal pole.
There is a tiny fat-containing umbilical hernia. Visualized bowel loops have a normal caliber. No worrisome marrow replacing lesion is seen.
IMPRESSION: 1.  Small portacaval lymph node. No correlate for the finding on previous ultrasound. Perhaps previously seen periportal lymph node has decreased in size and/or resolved.
2.  Hepatomegaly and hepatic steatosis. No enhancing hepatic lesion seen.
3.  No worrisome masses or adenopathy seen.
4.  Small left upper pole renal cyst.
5.  Additional comments and findings as above.

## 2022-07-19 IMAGING — MR MRI LSPINE WO CONTRAST
5 of 6 series · 25 of 48 positions shown · non-contrast
Comparison: MRI lumbar spine, 02/24/2020.

Images Obtained from [facility]
INDICATION: radiculopathy, lumbar region
TECHNIQUE: Multiplanar, multisequence MR imaging of the lumbar spine was performed without contrast.

[Series 1: bSSFP · axial · 8.0mm · 1.37mm/px · z∈[-61,+175]mm · 8 of 25 slices shown]
[im 1/25]
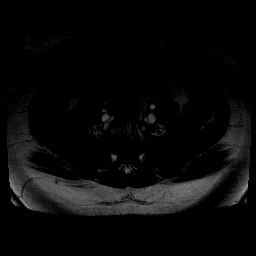
[im 3/25]
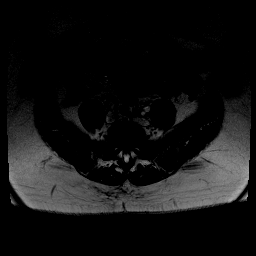
[im 9/25]
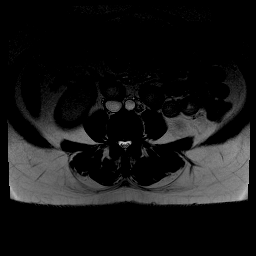
[im 11/25]
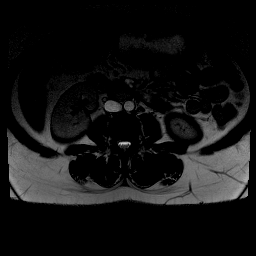
[im 14/25]
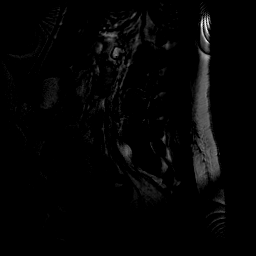
[im 17/25]
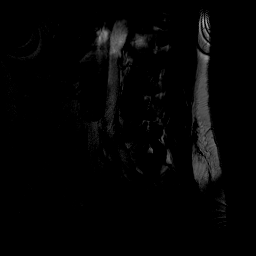
[im 22/25]
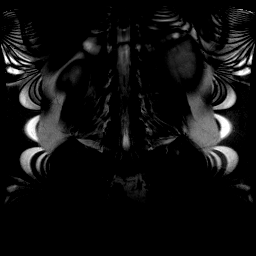
[im 25/25]
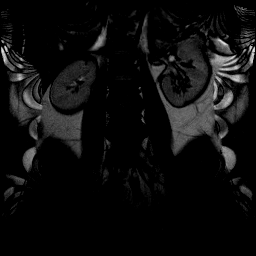

[Series 2: t2_sag · sagittal · 4.0mm · 0.41mm/px · 6 of 15 slices shown]
[im 1/15]
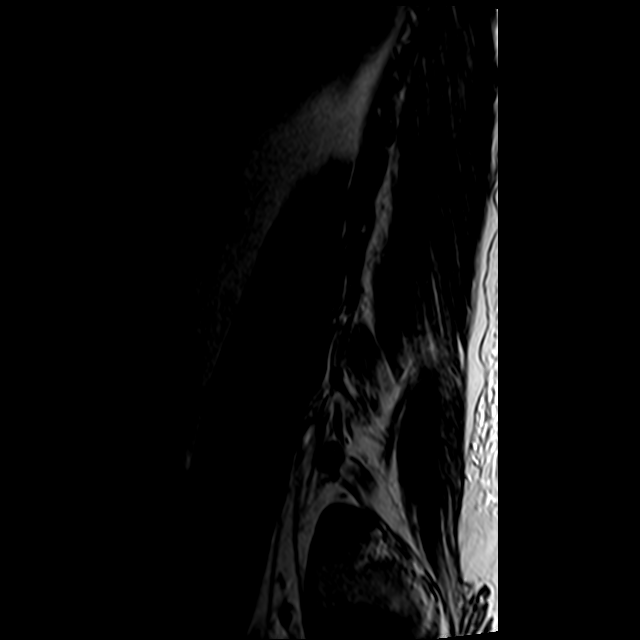
[im 3/15]
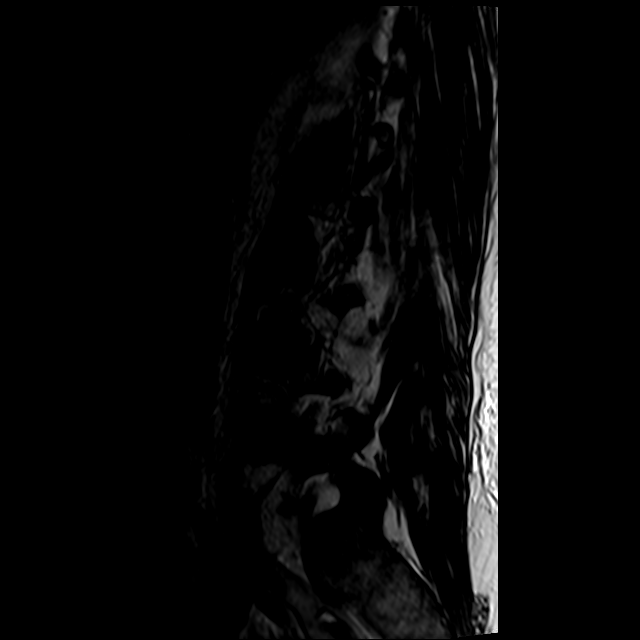
[im 6/15]
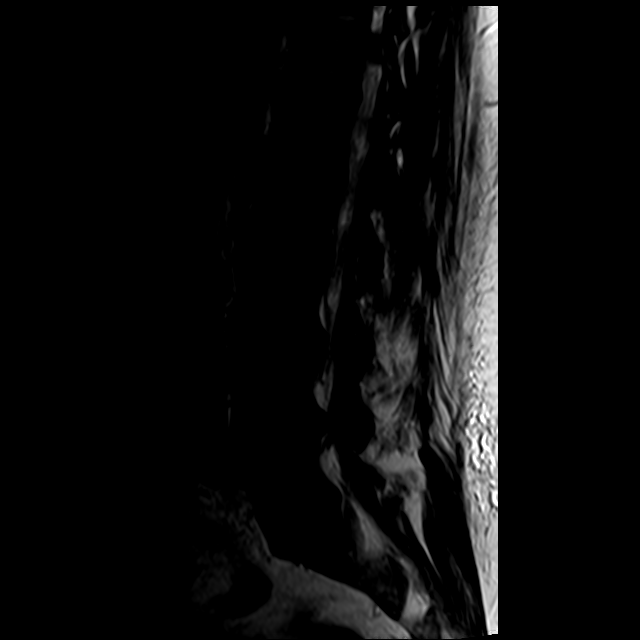
[im 9/15]
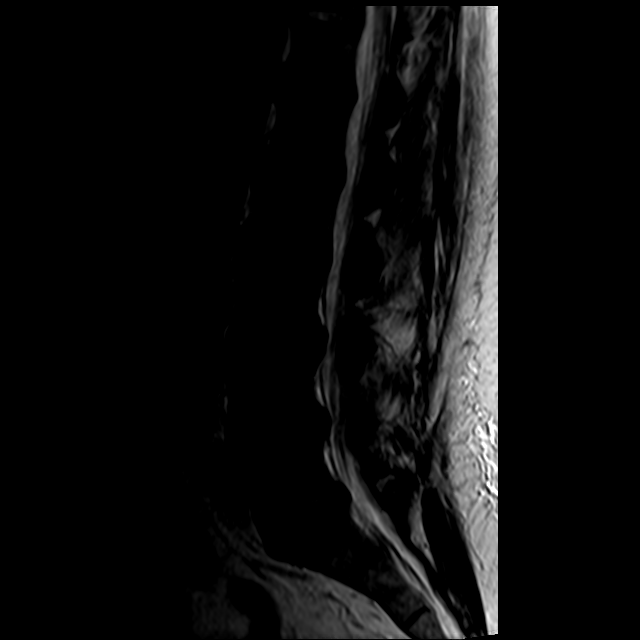
[im 12/15]
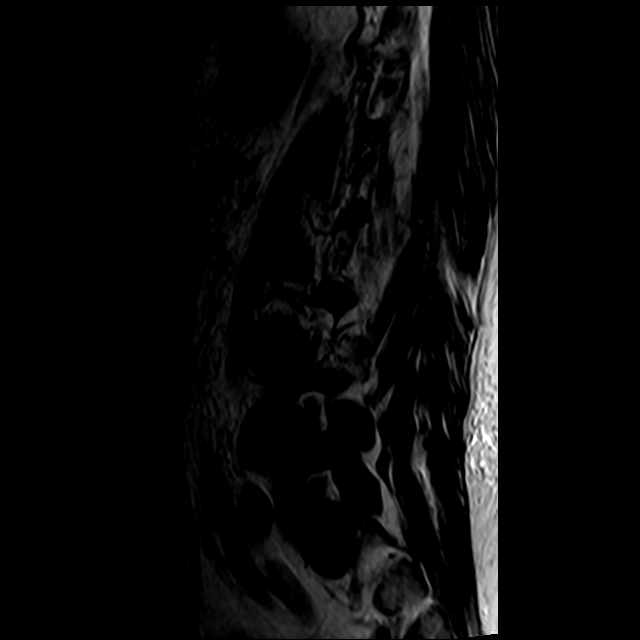
[im 15/15]
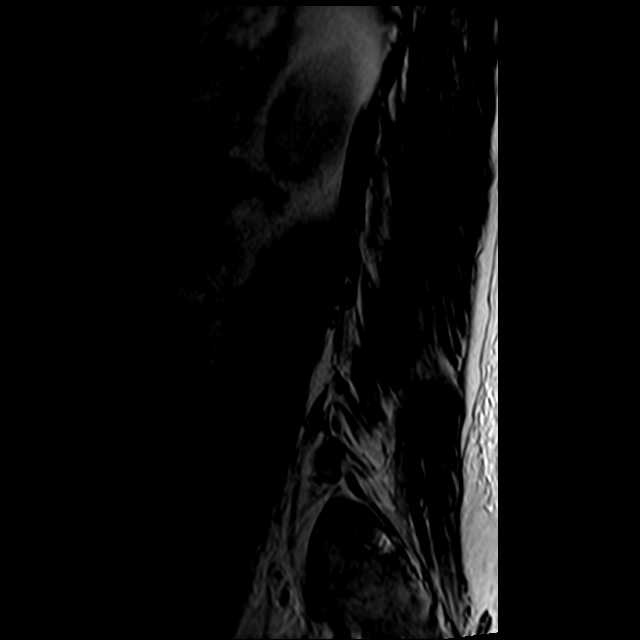

[Series 3: ir_sag · sagittal · 4.0mm · 0.51mm/px · 5 of 15 slices shown]
[im 1/15]
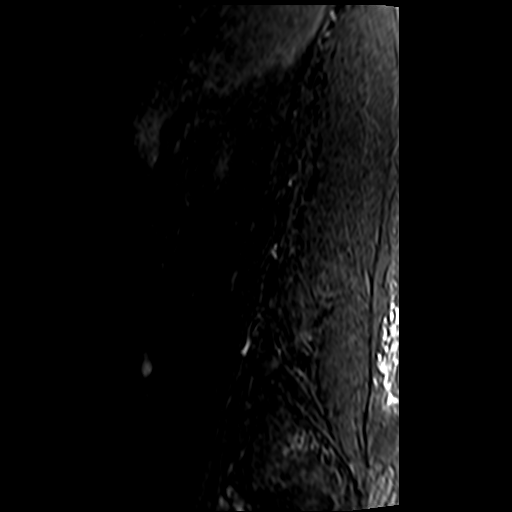
[im 4/15]
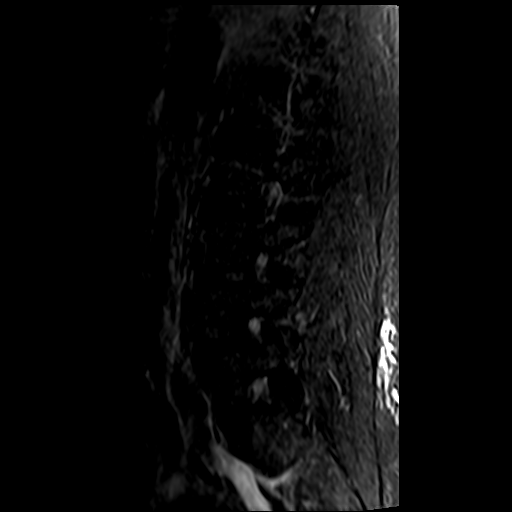
[im 8/15]
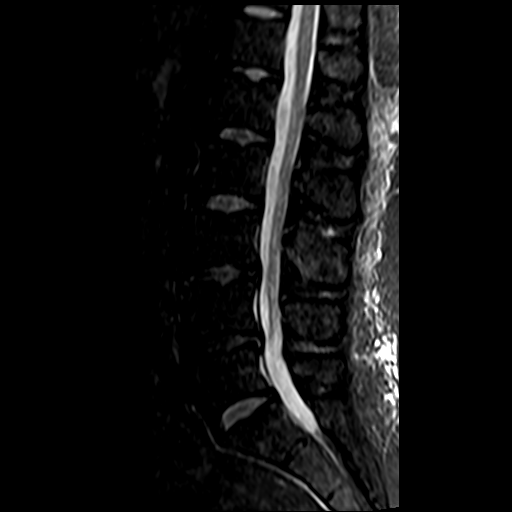
[im 11/15]
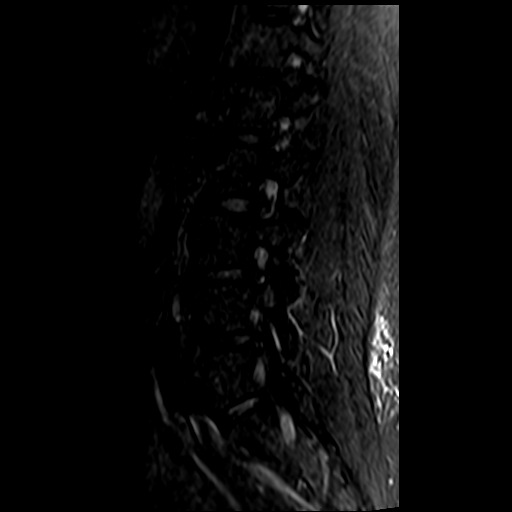
[im 15/15]
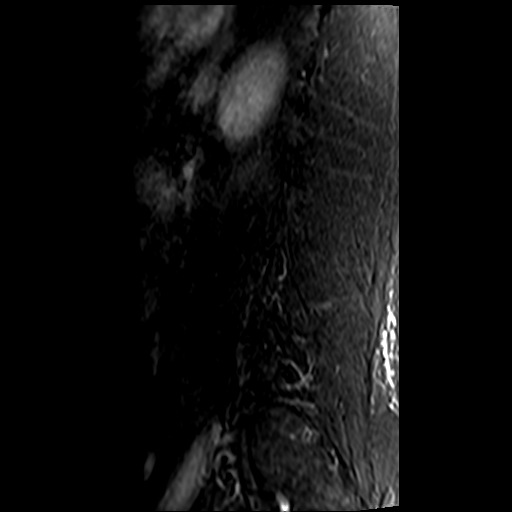

[Series 4: t1_sag · sagittal · 4.0mm · 0.81mm/px · 5 of 15 slices shown]
[im 1/15]
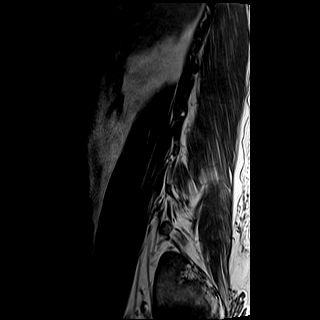
[im 4/15]
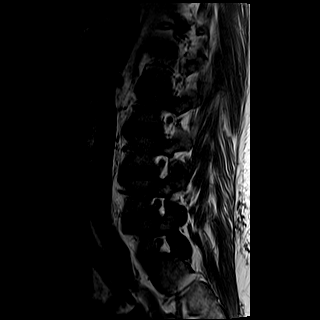
[im 8/15]
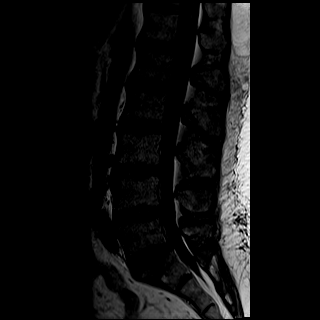
[im 11/15]
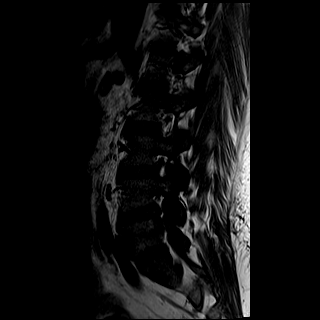
[im 15/15]
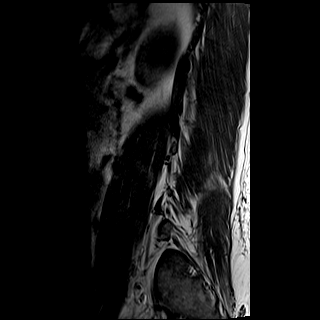

[Series 5: t1_axial_obl · axial · 4.0mm · 0.82mm/px · 1 of 27 slices shown]
[im 1/27]
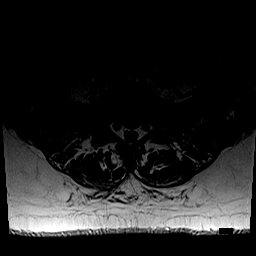

[25 of 48 positions shown; findings below may reference images not displayed]

FINDINGS: 5 nonrib-bearing lumbar vertebral bodies.
Lumbar vertebral body height is well maintained without evidence of a recent compression fracture. No aggressive or destructive osseous lesion.
Lumbar lordosis is maintained.
The conus medullaris is normal in size and signal intensity terminating near the L1-L2 level.
Multilevel, multifactorial degenerative changes of lumbar spine with moderately moderate disc desiccation L4-L5. Mild disc desiccation at L3-L4. No associated reactive endplate changes.
Level by level degenerative changes below:
T12-L1: No central spinal canal or neural foraminal stenosis. Unchanged.
L1-L2: No central spinal canal or neural foraminal stenosis. Unchanged.
L2-L3: No central spinal canal or neural foraminal stenosis. Unchanged.
L3-L4: Small disc bulge. Mild facet arthrosis and ligamentum flavum buckling. Mild central spinal canal stenosis. The bilateral neural foramen are patent. Unchanged.
L4-L5: Small disc bulge with small inferiorly migrated central disc extrusion, decreased in size from prior examination. Minimal facet arthrosis and ligamentum flavum buckling. Mild to moderate
central spinal canal stenosis. Mild left neural foraminal stenosis, similar prior examination. The right neural foramen is patent.
L5-S1: Small disc bulge. Mild right facet arthrosis. No central spinal canal stenosis. The bilateral neural foramina patent.
The visualized retroperitoneal structures are unremarkable.
IMPRESSION: 1.  Multilevel, multifactorial degenerative changes of the lumbar spine, not significantly changed when compared to 02/24/2020.
2.  Persistent mild to moderate central spinal canal stenosis at L4-L5 secondary to disc bulge with small inferiorly migrated central disc extrusion.
3.  Unchanged mild central spinal canal stenosis at L3-L4.
4.  Additional level by level details, as described above.

## 2022-12-10 IMAGING — CT CT CARDIAC CALCIUM SCORING
1 of 2 series · 11 of 20 positions shown, 14 images · non-contrast
Comparison: none

Images Obtained from [facility]
HISTORY: 41 years-old Female with Pure hypercholesterolemia, unspecified,.
COMPARISON
*  None.
TECHNIQUE
*  CT images of the heart were acquired without the administration of IV contrast
*  Post processing software quantified calcium (Threshold 130 HU) in the coronary arteries with Agatston scores
*  Total radiation dose to patient is CTDIvol 15.00 mGy and DLP 278.00 mGy-cm.
*
*  Dose reduction technique used: Automated exposure control and adjustment of the mA and/or kV according to patient size
*  CT Studies and Cardiac Nuclear Medicine Studies in last 12-months = 0.
REFERENCE TABLE
*  0: No evidence of CAD (very low risk of future coronary event)
*  1-10: Minimal CAD (low risk of future coronary event)
*  11-100: Mild CAD (increased risk of future coronary event)
*  101-400: Moderate CAD (relative lifetime risk of coronary event is 4.3 times a patient with a score of 0)*
*  200-8000: Severe CAD (relative lifetime risk of coronary event is 7.2 times a patient with a score of 0)*
*  >9444 (relative lifetime risk of coronary event is 10.8 times a patient with a score of 0)*
Number of Calcified Coronary Artery Plaques
*  Left Main: 0
*  Left Anterior Descending: 0
*  Circumflex: 0
*  Right Coronary Artery: 0
*  Total: 0
Coronary Artery Calcium Score
*  Left Circumflex: 0
*  Right Coronary Artery and Posterior Descending:
0
Total Agatston Score: 0
Additional Findings
*  Moderate-sized pericardial effusion.
IMPRESSION
*  Agatston score of 0
*  Moderate-sized pericardial effusion. Consider cardiology consultation.
RECOMMENDATION: Patient is at very low risk of future coronary events.

[Series 2: calcium score · axial · 0.48mm/px · z∈[-1265,-1141]mm · 11 of 101 slices shown, 14 images]
[im 9/101  vessel]
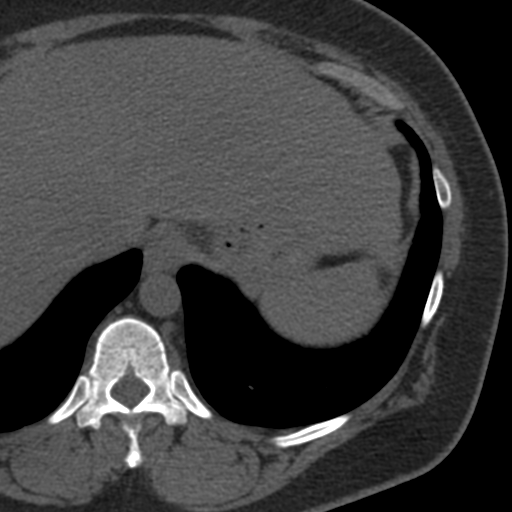
[im 9/101  lung]
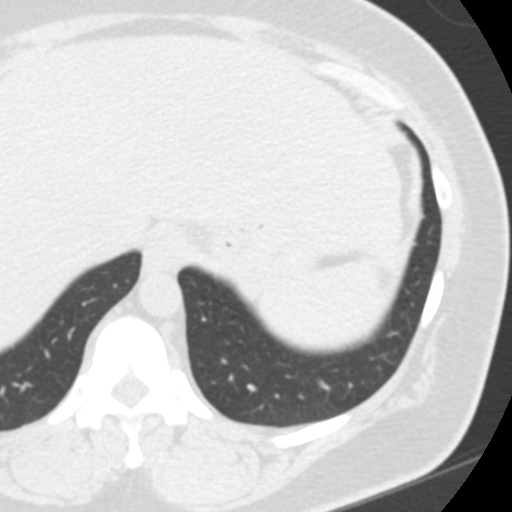
[im 17/101  vessel]
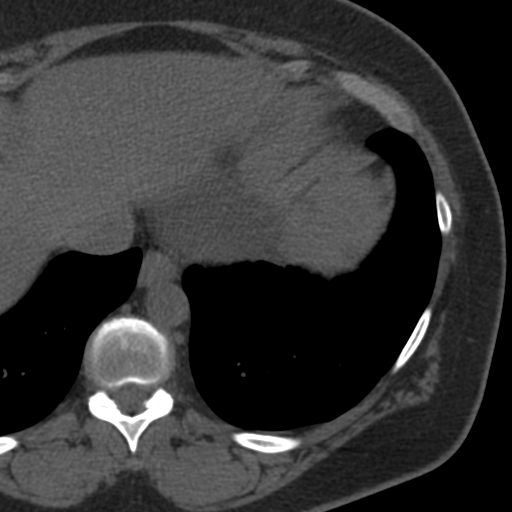
[im 26/101  vessel]
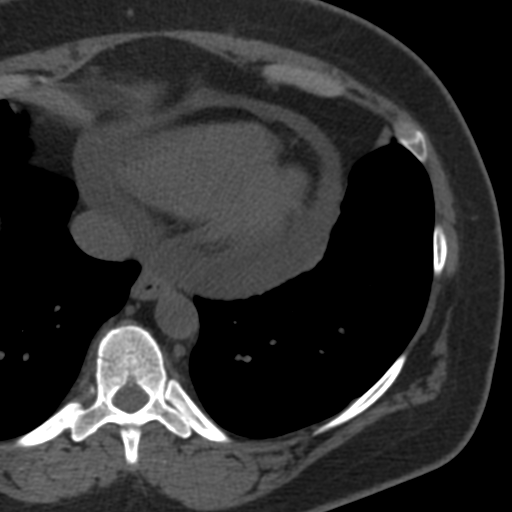
[im 34/101  vessel]
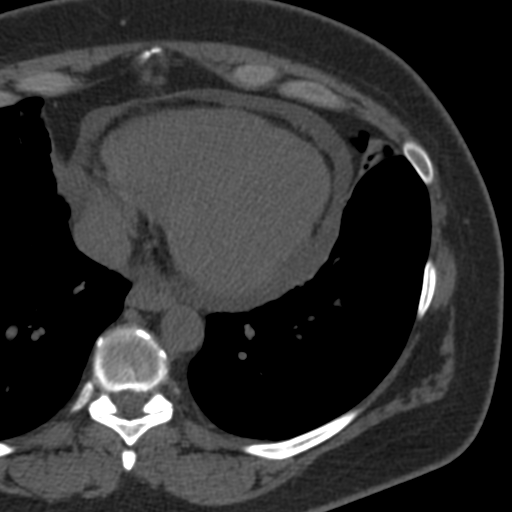
[im 42/101  vessel]
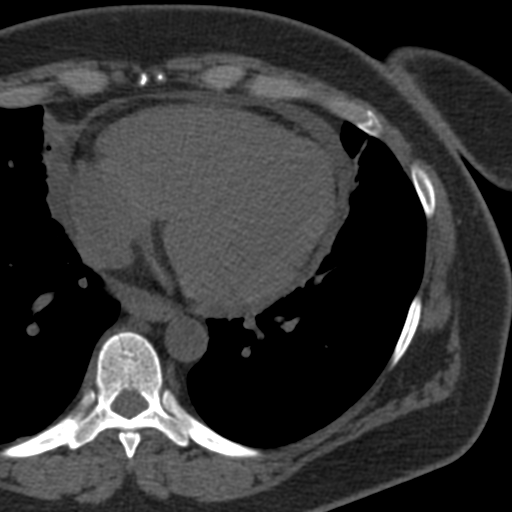
[im 42/101  lung]
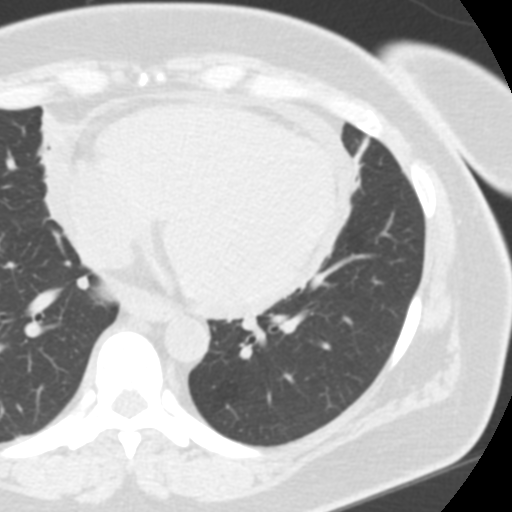
[im 51/101  vessel]
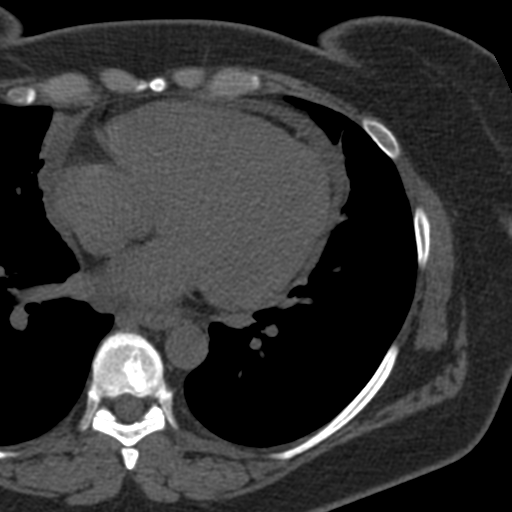
[im 59/101  vessel]
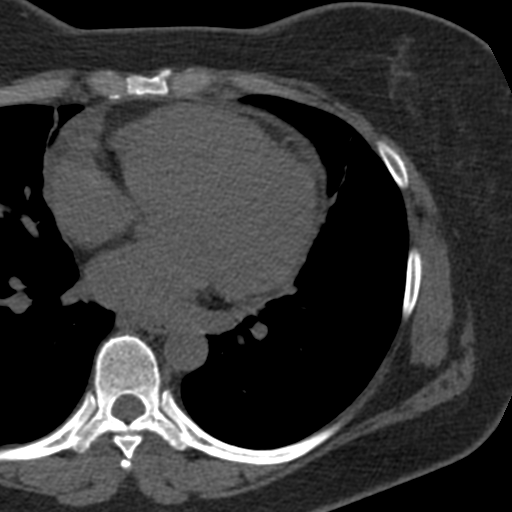
[im 67/101  vessel]
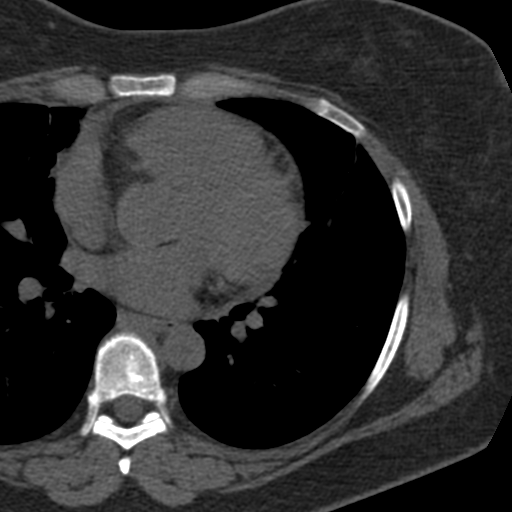
[im 76/101  vessel]
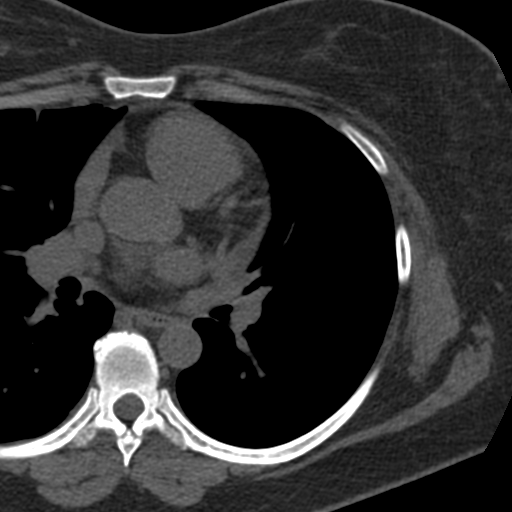
[im 76/101  lung]
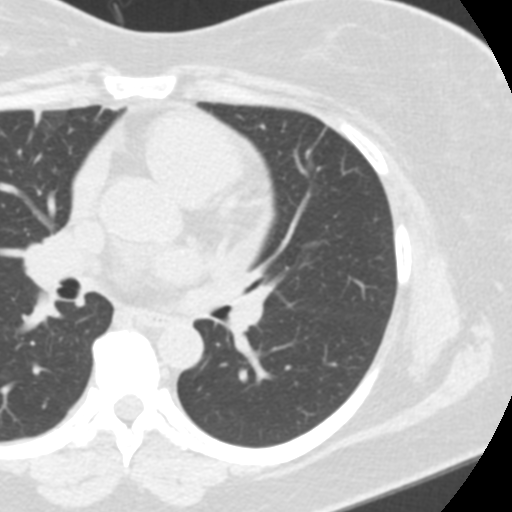
[im 84/101  vessel]
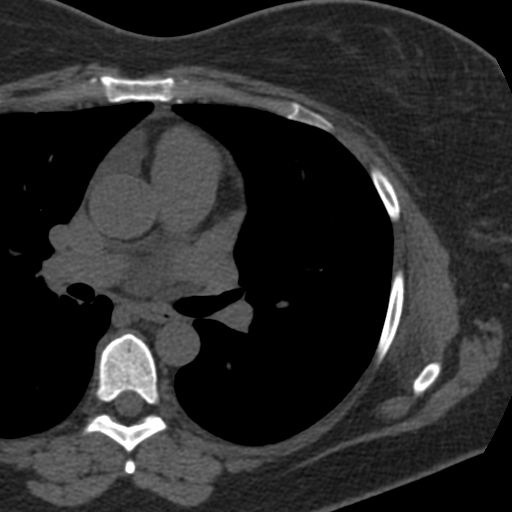
[im 92/101  vessel]
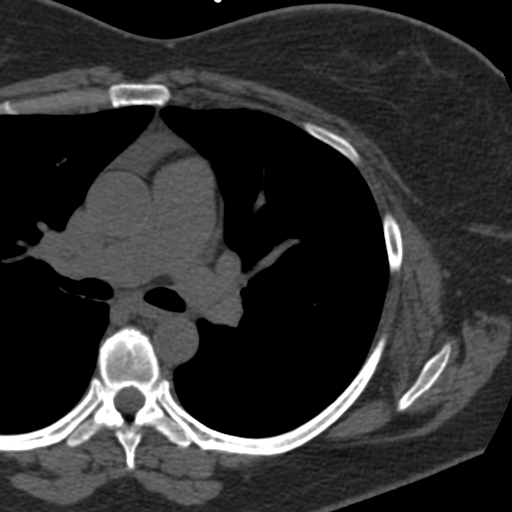

[11 of 20 positions shown; findings below may reference images not displayed]

## 2023-02-28 IMAGING — CR CHEST 2 VWS PA LAT
2 series · 2 of 2 positions shown · non-contrast
Comparison: 04/24/22

Images Obtained from [facility]
HISTORY: 41 years old Female with Encounter for other preprocedural examination
TECHNIQUE: 2 view radiograph of the chest.

[w chest pa]
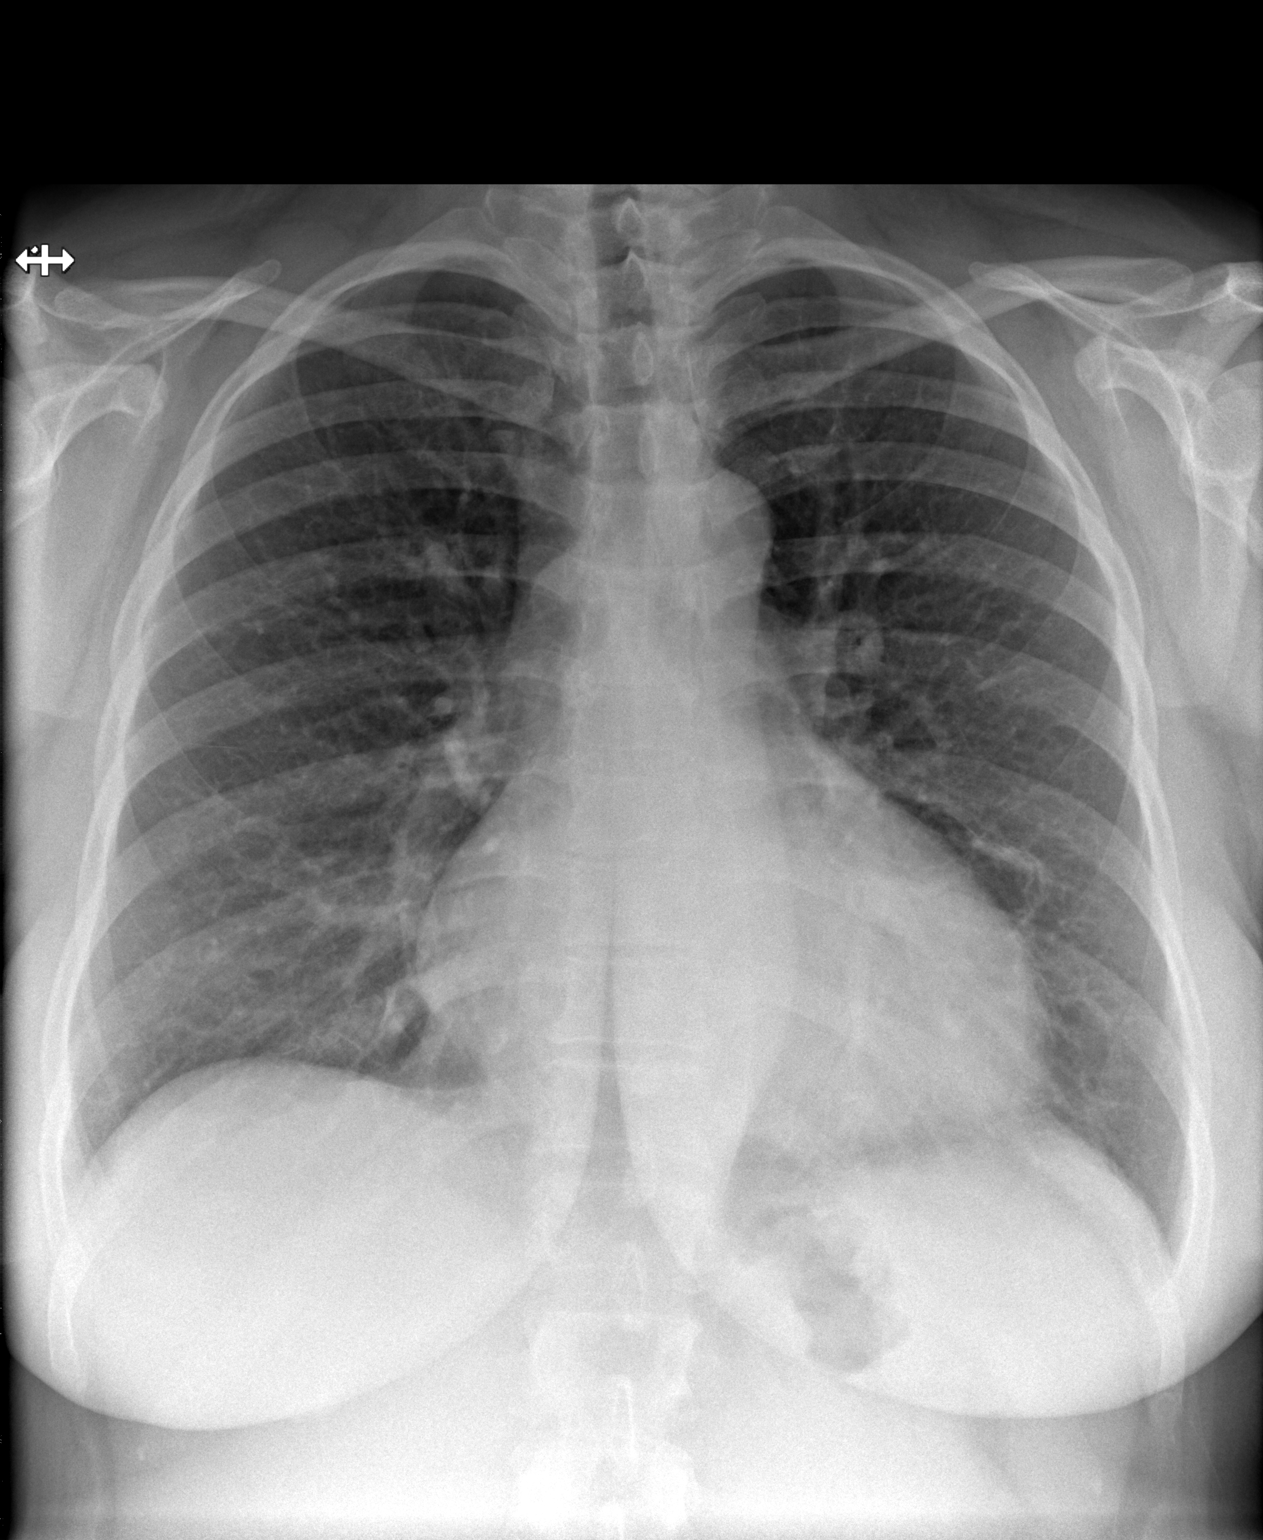

[w chest lat]
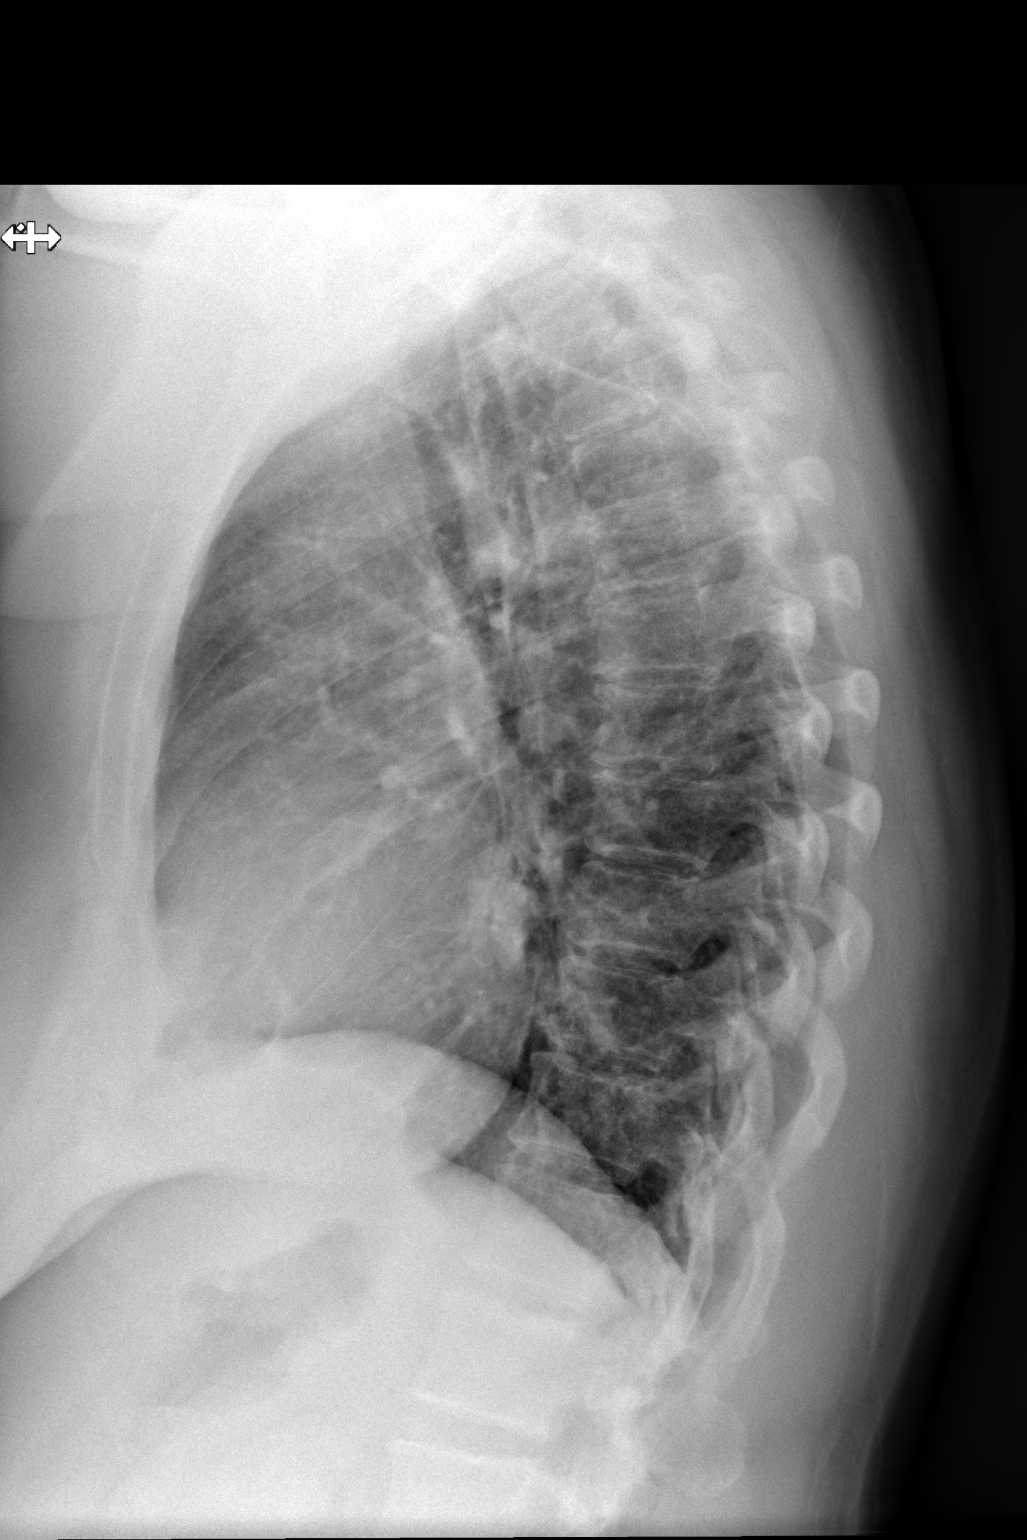

[2 of 2 positions shown; findings below may reference images not displayed]

FINDINGS: The heart remains top-end normal size.. The lungs are clear. No effusion.
IMPRESSION: No acute cardiopulmonary process.
